# Patient Record
Sex: Male | Born: 1955 | Race: White | Hispanic: No | Marital: Single | Smoking: Former smoker
Health system: Southern US, Community
[De-identification: ages and names within clinical notes are randomized; demographics above are authoritative.]

## PROBLEM LIST (undated history)

## (undated) DIAGNOSIS — I272 Pulmonary hypertension, unspecified: Secondary | ICD-10-CM

## (undated) DIAGNOSIS — Z8659 Personal history of other mental and behavioral disorders: Secondary | ICD-10-CM

## (undated) DIAGNOSIS — C44599 Other specified malignant neoplasm of skin of other part of trunk: Secondary | ICD-10-CM

## (undated) DIAGNOSIS — I83899 Varicose veins of unspecified lower extremities with other complications: Secondary | ICD-10-CM

## (undated) DIAGNOSIS — F17200 Nicotine dependence, unspecified, uncomplicated: Secondary | ICD-10-CM

## (undated) DIAGNOSIS — K449 Diaphragmatic hernia without obstruction or gangrene: Secondary | ICD-10-CM

## (undated) HISTORY — DX: Varicose veins of unspecified lower extremity with other complications: I83.899

## (undated) HISTORY — DX: Other specified malignant neoplasm of skin of other part of trunk: C44.599

## (undated) HISTORY — DX: Pulmonary hypertension, unspecified: I27.20

## (undated) HISTORY — PX: OTHER SURGICAL HISTORY: SHX169

## (undated) HISTORY — DX: Nicotine dependence, unspecified, uncomplicated: F17.200

## (undated) HISTORY — DX: Diaphragmatic hernia without obstruction or gangrene: K44.9

## (undated) HISTORY — DX: Personal history of other mental and behavioral disorders: Z86.59

---

## 2008-01-10 ENCOUNTER — Emergency Department: Payer: Self-pay | Admitting: Emergency Medicine

## 2008-01-16 ENCOUNTER — Emergency Department (HOSPITAL_COMMUNITY): Admission: EM | Admit: 2008-01-16 | Discharge: 2008-01-16 | Payer: Self-pay | Admitting: Family Medicine

## 2008-02-22 ENCOUNTER — Emergency Department: Payer: Self-pay | Admitting: Emergency Medicine

## 2008-03-05 DIAGNOSIS — C44599 Other specified malignant neoplasm of skin of other part of trunk: Secondary | ICD-10-CM

## 2008-03-05 DIAGNOSIS — C44509 Unspecified malignant neoplasm of skin of other part of trunk: Secondary | ICD-10-CM

## 2008-03-05 HISTORY — DX: Unspecified malignant neoplasm of skin of other part of trunk: C44.509

## 2008-03-05 HISTORY — DX: Other specified malignant neoplasm of skin of other part of trunk: C44.599

## 2008-04-19 ENCOUNTER — Ambulatory Visit: Payer: Self-pay | Admitting: Nurse Practitioner

## 2008-04-19 DIAGNOSIS — F17210 Nicotine dependence, cigarettes, uncomplicated: Secondary | ICD-10-CM

## 2008-04-19 DIAGNOSIS — I83899 Varicose veins of unspecified lower extremities with other complications: Secondary | ICD-10-CM

## 2008-04-19 LAB — CONVERTED CEMR LAB
ALT: 18 units/L (ref 0–53)
AST: 17 units/L (ref 0–37)
Albumin: 4.6 g/dL (ref 3.5–5.2)
Alkaline Phosphatase: 75 units/L (ref 39–117)
BUN: 7 mg/dL (ref 6–23)
Basophils Absolute: 0.1 10*3/uL (ref 0.0–0.1)
Basophils Relative: 1 % (ref 0–1)
CO2: 23 meq/L (ref 19–32)
Calcium: 9.8 mg/dL (ref 8.4–10.5)
Chloride: 101 meq/L (ref 96–112)
Creatinine, Ser: 0.72 mg/dL (ref 0.40–1.50)
Eosinophils Absolute: 0.1 10*3/uL (ref 0.0–0.7)
Eosinophils Relative: 1 % (ref 0–5)
Glucose, Bld: 83 mg/dL (ref 70–99)
HCT: 50 % (ref 39.0–52.0)
Hemoglobin: 16.7 g/dL (ref 13.0–17.0)
Lymphocytes Relative: 20 % (ref 12–46)
Lymphs Abs: 2.1 10*3/uL (ref 0.7–4.0)
MCHC: 33.4 g/dL (ref 30.0–36.0)
MCV: 92.6 fL (ref 78.0–100.0)
Monocytes Absolute: 0.8 10*3/uL (ref 0.1–1.0)
Monocytes Relative: 8 % (ref 3–12)
Neutro Abs: 7.7 10*3/uL (ref 1.7–7.7)
Neutrophils Relative %: 71 % (ref 43–77)
Platelets: 261 10*3/uL (ref 150–400)
Potassium: 5.3 meq/L (ref 3.5–5.3)
RBC: 5.4 M/uL (ref 4.22–5.81)
RDW: 13.4 % (ref 11.5–15.5)
Sodium: 140 meq/L (ref 135–145)
TSH: 1.301 microintl units/mL (ref 0.350–4.50)
Total Bilirubin: 0.4 mg/dL (ref 0.3–1.2)
Total Protein: 7.8 g/dL (ref 6.0–8.3)
WBC: 10.9 10*3/uL — ABNORMAL HIGH (ref 4.0–10.5)

## 2008-04-20 ENCOUNTER — Ambulatory Visit: Payer: Self-pay | Admitting: *Deleted

## 2008-05-04 ENCOUNTER — Ambulatory Visit: Payer: Self-pay | Admitting: Nurse Practitioner

## 2008-05-06 DIAGNOSIS — C4490 Unspecified malignant neoplasm of skin, unspecified: Secondary | ICD-10-CM

## 2008-05-14 ENCOUNTER — Telehealth (INDEPENDENT_AMBULATORY_CARE_PROVIDER_SITE_OTHER): Payer: Self-pay | Admitting: Nurse Practitioner

## 2008-05-21 ENCOUNTER — Ambulatory Visit: Payer: Self-pay | Admitting: Nurse Practitioner

## 2008-06-01 ENCOUNTER — Telehealth (INDEPENDENT_AMBULATORY_CARE_PROVIDER_SITE_OTHER): Payer: Self-pay | Admitting: Nurse Practitioner

## 2008-06-17 ENCOUNTER — Encounter (INDEPENDENT_AMBULATORY_CARE_PROVIDER_SITE_OTHER): Payer: Self-pay | Admitting: Nurse Practitioner

## 2008-06-23 ENCOUNTER — Encounter (INDEPENDENT_AMBULATORY_CARE_PROVIDER_SITE_OTHER): Payer: Self-pay | Admitting: Nurse Practitioner

## 2008-07-01 ENCOUNTER — Ambulatory Visit: Payer: Self-pay | Admitting: Nurse Practitioner

## 2008-07-01 DIAGNOSIS — Z8659 Personal history of other mental and behavioral disorders: Secondary | ICD-10-CM | POA: Insufficient documentation

## 2008-07-19 ENCOUNTER — Encounter (INDEPENDENT_AMBULATORY_CARE_PROVIDER_SITE_OTHER): Payer: Self-pay | Admitting: Nurse Practitioner

## 2008-08-09 ENCOUNTER — Encounter (INDEPENDENT_AMBULATORY_CARE_PROVIDER_SITE_OTHER): Payer: Self-pay | Admitting: Nurse Practitioner

## 2009-03-05 HISTORY — PX: SKIN CANCER EXCISION: SHX779

## 2010-11-16 ENCOUNTER — Encounter: Payer: Self-pay | Admitting: Family Medicine

## 2010-11-16 ENCOUNTER — Ambulatory Visit (INDEPENDENT_AMBULATORY_CARE_PROVIDER_SITE_OTHER): Payer: Self-pay | Admitting: Family Medicine

## 2010-11-16 DIAGNOSIS — F17200 Nicotine dependence, unspecified, uncomplicated: Secondary | ICD-10-CM

## 2010-11-16 DIAGNOSIS — Z8659 Personal history of other mental and behavioral disorders: Secondary | ICD-10-CM

## 2010-11-16 DIAGNOSIS — Z0289 Encounter for other administrative examinations: Secondary | ICD-10-CM

## 2010-11-16 NOTE — Progress Notes (Addendum)
Subjective:    Patient ID: Robert Griffin, male    DOB: [birth year], 55 y.o.   MRN: [id number]  HPI CC: new pt, establish   Would like form filled out.  Previously saw doctor that moved out of state (was in [place]).  Looks like previously saw Geographical information systems officer. Has regular driver's license but received DOT physical form to be filled out for medical clearance.  Unsure why. Does not drive commercially. States no limitation to driving.   No recent tickets.   Was checked in to [facility] hospital by sister 10 yrs ago, stayed 2-3 wks.  Doesn't know why this was done. H/o DUI 1970s.  No problems with EtOH currently. Hx schizophrenia in chart.  dx in early 20's per last OV note.  He has has several admissions to [facility] in the past. The last admission over 10 years ago.  No current medications  No current mental health treatment, not followed by psychiatrist currently.  States stable. Pt lives alone. Family is close by for support.  3 brothers, 3 sisters. No reports of abnormal behavior ie, agression, hearing voices, talking to himself.  Preventative: CPE last 2-3 years ago Unsure tetanus shot, thinks >10 yrs ago. Never had colonoscopy - discussed screening methods as well as relative costs, pt prefers to hold off for now.  No insurance. Discussed prostate screening, given father with h/o prostate cancer.  Pt prefers to hold off for now as well.  Medications and allergies reviewed and updated in chart.  Past histories reviewed and updated if relevant as below. Patient Active Problem List  Diagnoses  . CARCINOMA, BASAL CELL  . TOBACCO ABUSE  . VARICOSE VEINS, LOWER EXTREMITIES  . SCHIZOPHRENIA, HX OF   Past Medical History  Diagnosis Date  . History of schizophrenia   . Skin cancer of trunk 2011    shoulder, BCC s/p derm removal   Past Surgical History  Procedure Date  . Skin cancer excision 2011    on back  . Left wrist sugery 1980s    hand through glass, tendon reconstruction    History  Substance Use Topics  . Smoking status: Current Everyday Smoker -- 1.0 packs/day    Types: Cigarettes  . Smokeless tobacco: Never Used  . Alcohol Use: Yes     Occasional (6-12 beers/month)   Family History  Problem Relation Age of Onset  . Ovarian cancer Mother   . Cancer Mother     ?ovarian  . Prostate cancer Father   . Cancer Father 33    prostate cancer  . Mental illness Paternal Grandfather   . Coronary artery disease Neg Hx   . Stroke Neg Hx   . Diabetes Neg Hx   . Hyperlipidemia Neg Hx   . Alcohol abuse Neg Hx   . Arthritis Neg Hx    No Known Allergies No current outpatient prescriptions on file prior to visit.   Review of Systems  Constitutional: Negative for fever, chills, activity change, appetite change, fatigue and unexpected weight change.  HENT: Negative for hearing loss and neck pain.   Eyes: Negative for visual disturbance.  Respiratory: Negative for cough, chest tightness, shortness of breath and wheezing.   Cardiovascular: Negative for chest pain, palpitations and leg swelling.  Gastrointestinal: Negative for nausea, vomiting, abdominal pain, diarrhea, constipation, blood in stool and abdominal distention.  Genitourinary: Negative for hematuria and difficulty urinating.  Musculoskeletal: Negative for myalgias and arthralgias.  Skin: Negative for rash.  Neurological: Negative for dizziness, seizures, syncope and headaches.  Hematological: Does not bruise/bleed easily.  Psychiatric/Behavioral: Negative for dysphoric mood. The patient is not nervous/anxious.        Objective:   Physical Exam  Nursing note and vitals reviewed. Constitutional: He is oriented to person, place, and time. He appears well-developed and well-nourished. No distress.  HENT:  Head: Normocephalic and atraumatic.  Right Ear: External ear normal.  Left Ear: External ear normal.  Nose: Nose normal.  Mouth/Throat: Oropharynx is clear and moist.  Eyes: Conjunctivae and  EOM are normal. Pupils are equal, round, and reactive to light.  Neck: Normal range of motion. Neck supple. No thyromegaly present.  Cardiovascular: Normal rate, regular rhythm, normal heart sounds and intact distal pulses.   No murmur heard. Pulses:      Radial pulses are 2+ on the right side, and 2+ on the left side.  Pulmonary/Chest: Effort normal and breath sounds normal. No respiratory distress. He has no wheezes. He has no rales.  Abdominal: Soft. Bowel sounds are normal. He exhibits no distension and no mass. There is no tenderness. There is no rebound and no guarding.  Musculoskeletal: Normal range of motion.  Lymphadenopathy:    He has no cervical adenopathy.  Neurological: He is alert and oriented to person, place, and time.       CN grossly intact, station and gait intact  Skin: Skin is warm and dry. No rash noted.       Clubbing, no cyanosis  Psychiatric: His speech is normal and behavior is normal. Judgment and thought content normal. His affect is blunt. He is not agitated, not aggressive and is not hyperactive. Thought content is not paranoid and not delusional.          Assessment & Plan:

## 2010-11-16 NOTE — Assessment & Plan Note (Signed)
No records available currently on mental health history. Discussed reason for filling out form is hx schizophrenia in chart. Filled out form as able, discussed with pt they may want previous doctor's recs as this is my first visit with pt. Seems like pt has been stable off meds for >10 yrs, I don't see any reason to currently limit or restrict driving.

## 2010-11-16 NOTE — Assessment & Plan Note (Signed)
Encouraged cessation, 1 ppd currently.  Clubbing on exam today but pulse ox 98%.

## 2010-11-16 NOTE — Patient Instructions (Addendum)
Think about colon and prostate screening. Keep working on cutting back on smoking. If you want help with this, return to see [place]. Form for DOT filled out today. You are due to have cholesterol checked.  We will likely do this next time - return fasting for next appointment. Good to meet you today, call us with questions.  Return at your convenience for physical or as needed.

## 2010-12-05 LAB — GLUCOSE, CAPILLARY: Glucose-Capillary: 106 mg/dL — ABNORMAL HIGH (ref 70–99)

## 2010-12-05 LAB — POCT I-STAT, CHEM 8
Creatinine, Ser: 0.8 mg/dL (ref 0.4–1.5)
Hemoglobin: 18.4 g/dL — ABNORMAL HIGH (ref 13.0–17.0)
Potassium: 4 mEq/L (ref 3.5–5.1)
Sodium: 138 mEq/L (ref 135–145)
TCO2: 28 mmol/L (ref 0–100)

## 2012-04-04 ENCOUNTER — Encounter: Payer: Self-pay | Admitting: Family Medicine

## 2012-04-04 ENCOUNTER — Ambulatory Visit (INDEPENDENT_AMBULATORY_CARE_PROVIDER_SITE_OTHER): Payer: Self-pay | Admitting: Family Medicine

## 2012-04-04 VITALS — BP 120/70 | HR 71 | Temp 98.0°F | Ht 69.0 in | Wt 213.8 lb

## 2012-04-04 DIAGNOSIS — I8390 Asymptomatic varicose veins of unspecified lower extremity: Secondary | ICD-10-CM

## 2012-04-04 DIAGNOSIS — Z0279 Encounter for issue of other medical certificate: Secondary | ICD-10-CM

## 2012-04-04 DIAGNOSIS — Z8659 Personal history of other mental and behavioral disorders: Secondary | ICD-10-CM

## 2012-04-04 DIAGNOSIS — F17200 Nicotine dependence, unspecified, uncomplicated: Secondary | ICD-10-CM

## 2012-04-04 NOTE — Assessment & Plan Note (Signed)
Encourage cessation. °

## 2012-04-04 NOTE — Assessment & Plan Note (Signed)
Continue compression stockings

## 2012-04-04 NOTE — Assessment & Plan Note (Signed)
Compliant with [facility] treatment plan.

## 2012-04-04 NOTE — Patient Instructions (Addendum)
Return in 1 year or as needed. I suggest considering flu shot, tetanus shot, colon cancer screening ,prostate cancer screening (especially given family history), and blood work to check cholesterol and sugar. Cut down on alcohol, cut back on smoking.   best thing for your health is quitting smoking. Sign up for insurance for above to be covered.
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
## 2015-05-30 ENCOUNTER — Other Ambulatory Visit: Payer: Self-pay | Admitting: Family Medicine

## 2015-05-30 ENCOUNTER — Ambulatory Visit
Admission: RE | Admit: 2015-05-30 | Discharge: 2015-05-30 | Disposition: A | Discharge status: Home / Self Care | Payer: Medicaid Other | Source: Ambulatory Visit | Attending: Family Medicine | Admitting: Family Medicine

## 2015-05-30 DIAGNOSIS — I509 Heart failure, unspecified: Secondary | ICD-10-CM

## 2015-05-30 DIAGNOSIS — R0902 Hypoxemia: Secondary | ICD-10-CM

## 2015-06-04 DIAGNOSIS — I272 Pulmonary hypertension, unspecified: Secondary | ICD-10-CM

## 2015-06-04 HISTORY — DX: Pulmonary hypertension, unspecified: I27.20

## 2015-06-08 ENCOUNTER — Encounter: Payer: Self-pay | Admitting: Cardiology

## 2015-06-08 ENCOUNTER — Ambulatory Visit (INDEPENDENT_AMBULATORY_CARE_PROVIDER_SITE_OTHER): Payer: Medicaid Other | Admitting: Cardiology

## 2015-06-08 VITALS — BP 110/80 | HR 76 | Ht 70.5 in | Wt 248.8 lb

## 2015-06-08 DIAGNOSIS — I83893 Varicose veins of bilateral lower extremities with other complications: Secondary | ICD-10-CM

## 2015-06-08 DIAGNOSIS — R9431 Abnormal electrocardiogram [ECG] [EKG]: Secondary | ICD-10-CM | POA: Diagnosis not present

## 2015-06-08 DIAGNOSIS — F17200 Nicotine dependence, unspecified, uncomplicated: Secondary | ICD-10-CM | POA: Diagnosis not present

## 2015-06-08 DIAGNOSIS — R079 Chest pain, unspecified: Secondary | ICD-10-CM | POA: Diagnosis not present

## 2015-06-08 NOTE — Patient Instructions (Addendum)
Medication Instructions:  Your physician recommends that you continue on your current medications as directed. Please refer to the Current Medication list given to you today.   Labwork: none  Testing/Procedures: Your physician has requested that you have an echocardiogram. Echocardiography is a painless test that uses sound waves to create images of your heart. It provides your doctor with information about the size and shape of your heart and how well your heart's chambers and valves are working. This procedure takes approximately one hour. There are no restrictions for this procedure.    Follow-Up: Your physician recommends that you schedule a follow-up appointment in: two months with Dr. Ellyn Hack.    Any Other Special Instructions Will Be Listed Below (If Applicable).     If you need a refill on your cardiac medications before your next appointment, please call your pharmacy.  Echocardiogram An echocardiogram, or echocardiography, uses sound waves (ultrasound) to produce an image of your heart. The echocardiogram is simple, painless, obtained within a short period of time, and offers valuable information to your health care provider. The images from an echocardiogram can provide information such as:  Evidence of coronary artery disease (CAD).  Heart size.  Heart muscle function.  Heart valve function.  Aneurysm detection.  Evidence of a past heart attack.  Fluid buildup around the heart.  Heart muscle thickening.  Assess heart valve function. LET [facility] CARE PROVIDER KNOW ABOUT:  Any allergies you have.  All medicines you are taking, including vitamins, herbs, eye drops, creams, and over-the-counter medicines.  Previous problems you or members of your family have had with the use of anesthetics.  Any blood disorders you have.  Previous surgeries you have had.  Medical conditions you have.  Possibility of pregnancy, if this applies. BEFORE THE PROCEDURE   No special preparation is needed. Eat and drink normally.  PROCEDURE   In order to produce an image of your heart, gel will be applied to your chest and a wand-like tool (transducer) will be moved over your chest. The gel will help transmit the sound waves from the transducer. The sound waves will harmlessly bounce off your heart to allow the heart images to be captured in real-time motion. These images will then be recorded.  You may need an IV to receive a medicine that improves the quality of the pictures. AFTER THE PROCEDURE You may return to your normal schedule including diet, activities, and medicines, unless your health care provider tells you otherwise.   This information is not intended to replace advice given to you by your health care provider. Make sure you discuss any questions you have with your health care provider.   Document Released: 02/17/2000 Document Revised: 03/12/2014 Document Reviewed: 10/27/2012 Elsevier Interactive Patient Education [employer].

## 2015-06-08 NOTE — Progress Notes (Signed)
PATIENT: Robert Griffin MRN: [id number] DOB: 1955-09-16 PCP: Ria Bush, MD  Clinic Note: Chief Complaint  Patient presents with  . Advice Only    C/o sob. Meds reviewed verbally with pt.    HPI: Robert Griffin is a 60 y.o. male with a PMH below who presents today for Cardiology evaluation.He actually doesn't know why he is here. He cannot tell me anything more than his chronic edema and some exertional dyspnea.  Interval History: Robert Griffin was seen by his primary physician and referred for cardiology consultation with minimal inflammation indicating bilateral leg edema, basal rales with possible ascites and hypoxia (somehow his room air sat was 68%. He denied any significant resting dyspnea to me.  He was given a diagnosis of possible CHF and started on Lasix and lisinopril. No comment upon EKG or other findings.  [name] indicates to me that he had a significant amount of edema for a long time. Robert Griffin has gotten little bit worse and his legs are getting little bit uncomfortable with walking. He is a very difficult historian and is not very forthcoming with information. As far as I can tell he will get short of breath a little bit with walking maybe 200 yards, but really doesn't do much walking. If he does stairs or walks up an incline he will get a little bit short of breath as well. He denies any chest tightness or pressure associated with it. He denies any PND, orthopnea or edema but he does wake up sometimes short of breath, just not consistent with PND.  He has had some unusual twinging-like sensations in his chest off and on that is not associated with any particular activity. Not exacerbated by exertion.  The remainder of cardiac review of systems is as follows: Cardiovascular ROS:He denies any rapid irregular heartbeats palpitations. No sig. Near-syncope, TIA or amaurosis fugax symptoms.  Past Medical History  Diagnosis Date  . History of schizophrenia     [facility]  . Skin cancer of  trunk 2010    shoulder, BCC s/p derm removal  . Smoker     Chronic long-term smoker  . Hiatal hernia   . 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 Recently started on Lasix.    Prior Cardiac Evaluation and Past Surgical History: Past Surgical History  Procedure Laterality Date  . Skin cancer excision  2011    on back  . Left wrist sugery  1980s    hand through glass, tendon reconstruction    Allergies  Allergen Reactions  . Asa [Aspirin] Other (See Comments)    As a child unsure of reaction  . Penicillins Other (See Comments)    Pt cannot remember type of reaction    Current Outpatient Prescriptions  Medication Sig Dispense Refill  . furosemide (LASIX) 40 MG tablet Take 40 mg by mouth 2 (two) times daily.  1  . lisinopril (PRINIVIL,ZESTRIL) 30 MG tablet Take 30 mg by mouth daily.  0  . Paliperidone Palmitate (INVEGA SUSTENNA IM) Inject into the muscle. One injection once a month    . VITAMIN D, CHOLECALCIFEROL, PO Take by mouth once a week.     No current facility-administered medications for this visit.   Social History   Social History  . Marital Status: Single    Spouse Name: N/A  . Number of Children: N/A  . Years of Education: N/A   Occupational History  . unemployed    Social History  Main Topics  . Smoking status: Current Every Day Smoker -- 1.00 packs/day for 40 years    Types: Cigarettes  . Smokeless tobacco: Never Used     Comment: He rolls his own cigarettes - both tobacco and marijuana. No interest in stopping  . Alcohol Use: 0.0 oz/week    0 Standard drinks or equivalent per week     Comment: (6-12 beers/week)  . Drug Use: Yes    Special: Marijuana     Comment: MJ  . Sexual Activity: Not Asked   Other Topics Concern  . None   Social History Narrative   Caffeine: 2 cups coffee, 3 cups tea daily   Lives alone, 1 dog   Family in area - sister brought him today. Has 3 brothers and 3 sisters    Occupation: unemployed, was painting   9th grade education   Activity: no regular exercise   Diet: likes vegetables, good amt water         Family History family history includes Cancer in his mother; Cancer (age of onset: 29) in his father; Mental illness in his paternal grandfather. There is no history of Coronary artery disease, Stroke, Diabetes, Hyperlipidemia, Alcohol abuse, or Arthritis.  ROS: A comprehensive Review of Systems - Was performed. However he is a very difficult historian Review of Systems  Constitutional: Positive for weight loss (His weight is going down from about 260-248 pounds since starting Lasix.). Negative for malaise/fatigue.  HENT: Positive for congestion. Negative for nosebleeds.   Respiratory: Positive for cough (Chronic), shortness of breath (With walking maybe more than 200 feet or upstairs) and wheezing.   Cardiovascular: Positive for leg swelling (Chronic lower extremity swelling from venous stasis. This is been since 32. Finally now on Lasix. He has chronically used compression stockings.).  Gastrointestinal: Positive for heartburn. Negative for diarrhea, constipation and blood in stool.  Genitourinary: Negative for hematuria.  Musculoskeletal: Positive for back pain. Negative for joint pain and falls.  Neurological: Positive for headaches (ntermittent).       Neuropathic type pains in his feet  Psychiatric/Behavioral:       He has a diagnosis of schizophrenia    Weight from PCPs office was 261 pounds PHYSICAL EXAM BP 110/80 mmHg  Pulse 76  Ht 5' 10.5" (1.791 m)  Wt 248 lb 12 oz (112.832 kg)  BMI 35.18 kg/m2 General appearance: alert, cooperative, appears stated age, no distress, moderately obese and Somewhat unkempt appearing. Smells like cigarettes, and likely not bathed Neck: no adenopathy, no carotid bruit and Mild JVD with mild HJR. Difficult to tell due to body habitus. No LAN or thyromegaly Lungs: Diffuse infiltrate in a story  wheezes/rhonchi. I don't hear rales, or rhonchi. No dullness to percussion. There is accessory muscle use, but no increased worker breathing. Heart: normal apical impulse, regular rate and rhythm, S1, S2 normal, no S3 or S4 and No murmurs or rubs Abdomen: soft, non-tender; bowel sounds normal; no masses,  no organomegaly and Obese with potentially mild truncal edema. Does not seem to be any presacral edema however. Extremities: edema At least 2+ from mid thigh to knee. Tense 3+ bilateral edema from the knees to the ankles., no ulcers, gangrene or trophic changes, varicose veins noted and venous stasis dermatitis noted Pulses: 2+ and symmetric However pedal pulses are difficult to palpate due to edema. Skin: Brawny skin changes and bilateral lower extremity is consistent with venous stasis. Otherwise no rashes or lesions. Neurologic: Mental status: alertness: alert, orientation: time, date, person,  place, city, president, affect: blunted and flat, thought content exhibits logical connections, But is a poor historian Cranial nerves: normal Motor: grossly normal Gait: Normal   Adult ECG Report  Rate: 76 ;  Rhythm: normal sinus rhythm and 1 A-V block (208), rightward axis (102), inferior and anterior T-wave inversions. Cannot exclude ischemia.  Narrative Interpretation: Abnormal EKG with asymmetrical T-wave inversions in the absence of any anginal symptoms.  Recent Labs: None available  ASSESSMENT / PLAN: Very difficult situation, he clearly has chronic lower TIMI edema which is probably venous stasis in nature. He doesn't have a lot of heart failure symptoms. Although he didn't notice initially having any dyspnea or difficulties, he says that once he started his heart medicines felt that his heart was beating better, but he can't explain it any further. He has an abnormal EKG that would otherwise be concerning for ischemic changes, however he is not describing any anginal symptoms. I would've a low  threshold to consider stress test, for now I need to get a little better sense of what his symptoms are.   For now, I'll continue his ACE inhibitor and Lasix and check a 2-D echocardiogram to get a better assessment was EF is as well as diastolic function.  I will see him back soon in order to adjust any therapy is necessary.  Problem List Items Addressed This Visit    Varicose veins of lower extremity with edema (Chronic)    Checking 2-D echocardiogram to assess if there is any cardiac etiology involved. Continue compression stockings and Lasix.      Relevant Medications   furosemide (LASIX) 40 MG tablet   lisinopril (PRINIVIL,ZESTRIL) 30 MG tablet   TOBACCO ABUSE    Although I talked about smoking cessation, he did not seem to acknowledge my recommendation.      Pain in the chest - Primary    This discomfort does not sound at all cardiac in nature. We'll continue to monitor for symptoms based on the fact is that abnormal EKG. I don't think the EKG is ischemic in nature.      Relevant Medications   furosemide (LASIX) 40 MG tablet   lisinopril (PRINIVIL,ZESTRIL) 30 MG tablet   Other Relevant Orders   EKG 12-Lead (Completed)   Abnormal EKG    EKG is deathly abnormal with "cannot exclude ischemia "component. However he is not really describing anginal symptoms. I will have a low threshold to check ischemia with a stress test, however for now we'll simply start with an echocardiogram and reassess in 2 months.      Relevant Orders   Echocardiogram (Completed)      Meds ordered this encounter  Medications  . furosemide (LASIX) 40 MG tablet    Sig: Take 40 mg by mouth 2 (two) times daily.    Refill:  1  . lisinopril (PRINIVIL,ZESTRIL) 30 MG tablet    Sig: Take 30 mg by mouth daily.    Refill:  0  . VITAMIN D, CHOLECALCIFEROL, PO    Sig: Take by mouth once a week.    Followup: 2 months  Alexsus Papadopoulos W. Ellyn Hack, M.D., M.S. Interventional Cardiol[facility]

## 2015-06-10 ENCOUNTER — Other Ambulatory Visit (INDEPENDENT_AMBULATORY_CARE_PROVIDER_SITE_OTHER): Payer: Medicaid Other

## 2015-06-10 ENCOUNTER — Other Ambulatory Visit: Payer: Self-pay

## 2015-06-10 ENCOUNTER — Encounter: Payer: Self-pay | Admitting: Cardiology

## 2015-06-10 DIAGNOSIS — R079 Chest pain, unspecified: Secondary | ICD-10-CM | POA: Insufficient documentation

## 2015-06-10 DIAGNOSIS — R9431 Abnormal electrocardiogram [ECG] [EKG]: Secondary | ICD-10-CM | POA: Diagnosis not present

## 2015-06-10 NOTE — Assessment & Plan Note (Signed)
This discomfort does not sound at all cardiac in nature. We'll continue to monitor for symptoms based on the fact is that abnormal EKG. I don't think the EKG is ischemic in nature.

## 2015-06-10 NOTE — Assessment & Plan Note (Signed)
EKG is deathly abnormal with "cannot exclude ischemia "component. However he is not really describing anginal symptoms. I will have a low threshold to check ischemia with a stress test, however for now we'll simply start with an echocardiogram and reassess in 2 months.

## 2015-06-10 NOTE — Assessment & Plan Note (Signed)
Checking 2-D echocardiogram to assess if there is any cardiac etiology involved. Continue compression stockings and Lasix.

## 2015-06-10 NOTE — Assessment & Plan Note (Signed)
Although I talked about smoking cessation, he did not seem to acknowledge my recommendation.

## 2015-06-14 ENCOUNTER — Encounter: Payer: Self-pay | Admitting: Family Medicine

## 2015-08-08 ENCOUNTER — Telehealth: Payer: Self-pay | Admitting: Cardiology

## 2015-08-08 NOTE — Telephone Encounter (Signed)
Have him take 80 BID Lasix x 3 days, then 80 am & 40 pm x 3 days.  If no better after 80 mg BID (i.e no brisk urine output) - would recommend going in to ER -- may need IV Lasix.   Glenetta Hew, MD

## 2015-08-08 NOTE — Telephone Encounter (Signed)
Pt c/o swelling: STAT is pt has developed SOB within 24 hours  1. How long have you been experiencing swelling? Couple months, but has gotten worse  2. Where is the swelling located? Both feet, legs, and stomach  3.  Are you currently taking a "fluid pill"? no  4.  Are you currently SOB? Has been SOB when he walks  5.  Have you traveled recently? no

## 2015-08-08 NOTE — Telephone Encounter (Signed)
Called pt at home number. No answer, no VM. S/w pt sister, Gwinda Passe, (on [place]) who reports pt has had increased fluid retention. She is unsure when this actually started but states "he looks like he is going to have triplets" He has been experiencing abdominal and LE swelling. States pt does not have scales therefore, is unsure of how much weight pt has gained.  Sister is unsure of what medications he currently takes however, she states he takes two pills and his vitamin every day. Reports SOB "if he walks a little bit. It doesn't take much to get him winded". Pt lives alone, approximately 8 miles from his sister and does not drive. Family provides transportation. Sister contacted Dr. Rex Kras, PCP in [place] regarding sx and states she was told to call cardiology. Dr. Rex Kras provides refills on lasix and lisinopril and sister thinks he is still taking these. He has f/u appt June 14 w/Dr. Ellyn Hack.  Per echo results notes:   "Cardiac shows normal left ventricle function. Normal regional wall motion and normal relaxation. It also shows significantly elevated pressures in the right ventricle suggesting pulmonary hypertension. Unfortunately were not able to calculate the level of pulmonary hypertension.  When I see him back in follow-up, we can discuss potentially doing a right heart catheterization to accurately measure pressures.  Most likely this is related to OSA - this could be contributing to his edema."  Suggested sister have pt purchase or borrow scales for monitoring daily weights, continue current medications, follow low sodium diet. Advised her to have pt seen in ED setting if continued SOB and swelling.  Will forward to MD to make aware. She verbalized understanding and is agreeable w/plan. She had no further questions.

## 2015-08-09 NOTE — Telephone Encounter (Signed)
Spoke w/ Gwinda Passe.  Advised her of Dr. Allison Quarry recommendation.   She verbalizes understanding and will call back if sx do not improve.

## 2015-08-17 ENCOUNTER — Ambulatory Visit: Payer: Medicaid Other | Admitting: Cardiology

## 2015-08-17 ENCOUNTER — Encounter: Payer: Self-pay | Admitting: *Deleted

## 2015-08-27 ENCOUNTER — Encounter (HOSPITAL_COMMUNITY): Payer: Self-pay | Admitting: *Deleted

## 2015-08-27 ENCOUNTER — Inpatient Hospital Stay (HOSPITAL_COMMUNITY)
Admission: EM | Admit: 2015-08-27 | Discharge: 2015-09-01 | DRG: 291 | Disposition: A | Discharge status: Home / Self Care | Payer: Medicaid Other | Attending: Internal Medicine | Admitting: Internal Medicine

## 2015-08-27 ENCOUNTER — Emergency Department (HOSPITAL_COMMUNITY): Payer: Medicaid Other

## 2015-08-27 DIAGNOSIS — E876 Hypokalemia: Secondary | ICD-10-CM | POA: Diagnosis present

## 2015-08-27 DIAGNOSIS — L97519 Non-pressure chronic ulcer of other part of right foot with unspecified severity: Secondary | ICD-10-CM | POA: Diagnosis present

## 2015-08-27 DIAGNOSIS — I871 Compression of vein: Secondary | ICD-10-CM

## 2015-08-27 DIAGNOSIS — J8410 Pulmonary fibrosis, unspecified: Secondary | ICD-10-CM | POA: Diagnosis present

## 2015-08-27 DIAGNOSIS — F1010 Alcohol abuse, uncomplicated: Secondary | ICD-10-CM

## 2015-08-27 DIAGNOSIS — J9601 Acute respiratory failure with hypoxia: Secondary | ICD-10-CM | POA: Diagnosis present

## 2015-08-27 DIAGNOSIS — I110 Hypertensive heart disease with heart failure: Principal | ICD-10-CM | POA: Diagnosis present

## 2015-08-27 DIAGNOSIS — J441 Chronic obstructive pulmonary disease with (acute) exacerbation: Secondary | ICD-10-CM | POA: Diagnosis present

## 2015-08-27 DIAGNOSIS — I872 Venous insufficiency (chronic) (peripheral): Secondary | ICD-10-CM | POA: Diagnosis present

## 2015-08-27 DIAGNOSIS — Z85828 Personal history of other malignant neoplasm of skin: Secondary | ICD-10-CM

## 2015-08-27 DIAGNOSIS — Z9114 Patient's other noncompliance with medication regimen: Secondary | ICD-10-CM

## 2015-08-27 DIAGNOSIS — Z818 Family history of other mental and behavioral disorders: Secondary | ICD-10-CM

## 2015-08-27 DIAGNOSIS — I5033 Acute on chronic diastolic (congestive) heart failure: Secondary | ICD-10-CM | POA: Diagnosis present

## 2015-08-27 DIAGNOSIS — Z046 Encounter for general psychiatric examination, requested by authority: Secondary | ICD-10-CM

## 2015-08-27 DIAGNOSIS — Z886 Allergy status to analgesic agent status: Secondary | ICD-10-CM

## 2015-08-27 DIAGNOSIS — R0902 Hypoxemia: Secondary | ICD-10-CM

## 2015-08-27 DIAGNOSIS — R140 Abdominal distension (gaseous): Secondary | ICD-10-CM

## 2015-08-27 DIAGNOSIS — Z8042 Family history of malignant neoplasm of prostate: Secondary | ICD-10-CM

## 2015-08-27 DIAGNOSIS — E872 Acidosis: Secondary | ICD-10-CM | POA: Diagnosis present

## 2015-08-27 DIAGNOSIS — R188 Other ascites: Secondary | ICD-10-CM | POA: Diagnosis present

## 2015-08-27 DIAGNOSIS — J9691 Respiratory failure, unspecified with hypoxia: Secondary | ICD-10-CM

## 2015-08-27 DIAGNOSIS — J9692 Respiratory failure, unspecified with hypercapnia: Secondary | ICD-10-CM

## 2015-08-27 DIAGNOSIS — D696 Thrombocytopenia, unspecified: Secondary | ICD-10-CM | POA: Diagnosis present

## 2015-08-27 DIAGNOSIS — F209 Schizophrenia, unspecified: Secondary | ICD-10-CM

## 2015-08-27 DIAGNOSIS — I272 Other secondary pulmonary hypertension: Secondary | ICD-10-CM | POA: Diagnosis present

## 2015-08-27 DIAGNOSIS — Z79899 Other long term (current) drug therapy: Secondary | ICD-10-CM

## 2015-08-27 DIAGNOSIS — F17210 Nicotine dependence, cigarettes, uncomplicated: Secondary | ICD-10-CM | POA: Diagnosis present

## 2015-08-27 DIAGNOSIS — D751 Secondary polycythemia: Secondary | ICD-10-CM | POA: Diagnosis present

## 2015-08-27 DIAGNOSIS — I2781 Cor pulmonale (chronic): Secondary | ICD-10-CM | POA: Diagnosis present

## 2015-08-27 DIAGNOSIS — Z8659 Personal history of other mental and behavioral disorders: Secondary | ICD-10-CM

## 2015-08-27 DIAGNOSIS — I2609 Other pulmonary embolism with acute cor pulmonale: Secondary | ICD-10-CM

## 2015-08-27 DIAGNOSIS — R748 Abnormal levels of other serum enzymes: Secondary | ICD-10-CM | POA: Diagnosis present

## 2015-08-27 DIAGNOSIS — I5031 Acute diastolic (congestive) heart failure: Secondary | ICD-10-CM | POA: Diagnosis present

## 2015-08-27 DIAGNOSIS — I509 Heart failure, unspecified: Secondary | ICD-10-CM

## 2015-08-27 DIAGNOSIS — Z880 Allergy status to penicillin: Secondary | ICD-10-CM

## 2015-08-27 LAB — CBC WITH DIFFERENTIAL/PLATELET
BASOS ABS: 0 10*3/uL (ref 0.0–0.1)
Basophils Relative: 1 %
EOS ABS: 0.3 10*3/uL (ref 0.0–0.7)
EOS PCT: 3 %
HEMATOCRIT: 52.3 % — AB (ref 39.0–52.0)
HEMOGLOBIN: 17.2 g/dL — AB (ref 13.0–17.0)
Lymphocytes Relative: 16 %
Lymphs Abs: 1.4 10*3/uL (ref 0.7–4.0)
MCH: 30.4 pg (ref 26.0–34.0)
MCHC: 32.9 g/dL (ref 30.0–36.0)
MCV: 92.6 fL (ref 78.0–100.0)
MONO ABS: 1.1 10*3/uL — AB (ref 0.1–1.0)
Monocytes Relative: 14 %
Neutro Abs: 5.6 10*3/uL (ref 1.7–7.7)
Neutrophils Relative %: 66 %
Platelets: 160 10*3/uL (ref 150–400)
RBC: 5.65 MIL/uL (ref 4.22–5.81)
RDW: 15.6 % — ABNORMAL HIGH (ref 11.5–15.5)
WBC: 8.4 10*3/uL (ref 4.0–10.5)

## 2015-08-27 LAB — COMPREHENSIVE METABOLIC PANEL
ALBUMIN: 3.1 g/dL — AB (ref 3.5–5.0)
ALK PHOS: 55 U/L (ref 38–126)
ALT: 18 U/L (ref 17–63)
AST: 28 U/L (ref 15–41)
Anion gap: 7 (ref 5–15)
BILIRUBIN TOTAL: 0.9 mg/dL (ref 0.3–1.2)
BUN: 7 mg/dL (ref 6–20)
CALCIUM: 8.6 mg/dL — AB (ref 8.9–10.3)
CO2: 27 mmol/L (ref 22–32)
Chloride: 101 mmol/L (ref 101–111)
Creatinine, Ser: 0.72 mg/dL (ref 0.61–1.24)
GFR calc Af Amer: >60 mL/min (ref >60)
GFR calc non Af Amer: >60 mL/min (ref >60)
GLUCOSE: 89 mg/dL (ref 65–99)
Potassium: 4.2 mmol/L (ref 3.5–5.1)
Sodium: 135 mmol/L (ref 135–145)
TOTAL PROTEIN: 6.7 g/dL (ref 6.5–8.1)

## 2015-08-27 LAB — PROTIME-INR
INR: 1.26 (ref 0.00–1.49)
Prothrombin Time: 15.9 seconds — ABNORMAL HIGH (ref 11.6–15.2)

## 2015-08-27 LAB — BRAIN NATRIURETIC PEPTIDE: B NATRIURETIC PEPTIDE 5: 432.9 pg/mL — AB (ref 0.0–100.0)

## 2015-08-27 LAB — ETHANOL: Alcohol, Ethyl (B): <5 mg/dL (ref <5)

## 2015-08-27 LAB — I-STAT TROPONIN, ED: TROPONIN I, POC: 0.06 ng/mL (ref 0.00–0.08)

## 2015-08-27 NOTE — ED Notes (Signed)
Pt has in belonging bag:  White t-shirt, cameo cargo shorts, cameo crocs.

## 2015-08-27 NOTE — ED Provider Notes (Signed)
CSN: [id number]     Arrival date & time 08/27/15  2202 History  By signing my name below, I, Meriel Flavors, attest that this documentation has been prepared under the direction and in the presence of Iola Turri PA-C.  Electronically Signed: Meriel Flavors, ED Scribe. 07/31/2015. 4:01 PM.   No chief complaint on file.  The history is provided by the patient and the police. No language interpreter was used.   HPI Comments: Robert Griffin is a 60 y.o. male with a PMHx of schizophrenia, skin cancer, HTN, EtOH abuse who presents to the Emergency Department after being IVC's by his sister due to pt stopping medications and reports that he has been barricading himself in his home. Pt was brought in by [facility] and states that pt admitted to stopping his medication and has been cooperative since he arrived at the pt's house. Pt denies any difficulty breathing recently. Pt also reports regular erythema and edema to his bilateral lower extremities. Pt states it has been several months since he has injected his [place] for schizophrenia. Pt states he has had surgery to his wrist and a skin cancer excision on his back. Pt denies any abnormal bowel or bladder habits.  Past Medical History  Diagnosis Date  . History of schizophrenia     [facility]  . Skin cancer of trunk 2010    shoulder, BCC s/p derm removal  . Smoker     Chronic long-term smoker  . Hiatal hernia   . 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 Recently started on Lasix.  . Pulmonary hypertension ([place]) 06/2015    by echo   Past Surgical History  Procedure Laterality Date  . Skin cancer excision  2011    on back  . Left wrist sugery  1980s    hand through glass, tendon reconstruction   Family History  Problem Relation Age of Onset  . Cancer Mother     ?ovarian  . Cancer Father 17    prostate cancer  . Mental illness Paternal Grandfather   . Coronary artery disease Neg Hx   .  Stroke Neg Hx   . Diabetes Neg Hx   . Hyperlipidemia Neg Hx   . Alcohol abuse Neg Hx   . Arthritis Neg Hx    Social History  Substance Use Topics  . Smoking status: Current Every Day Smoker -- 1.00 packs/day for 40 years    Types: Cigarettes  . Smokeless tobacco: Never Used     Comment: He rolls his own cigarettes - both tobacco and marijuana. No interest in stopping  . Alcohol Use: 0.0 oz/week    0 Standard drinks or equivalent per week     Comment: (6-12 beers/week)    Review of Systems  Constitutional: Negative for fever.  Respiratory: Negative for shortness of breath.       Allergies  Asa and Penicillins  Home Medications   Prior to Admission medications   Medication Sig Start Date End Date Taking? Authorizing Provider  acetaminophen (TYLENOL) 500 MG tablet Take 500 mg by mouth every 6 (six) hours as needed (for pain.).   Yes Historical Provider, MD  furosemide (LASIX) 40 MG tablet Take 40 mg by mouth 2 (two) times daily. 05/25/15  Yes Historical Provider, MD  ibuprofen (ADVIL,MOTRIN) 200 MG tablet Take 400 mg by mouth every 6 (six) hours as needed (for pain.).   Yes Historical Provider, MD  lisinopril (PRINIVIL,ZESTRIL) 30 MG  tablet Take 30 mg by mouth daily. 05/25/15  Yes Historical Provider, MD  Paliperidone Palmitate (INVEGA SUSTENNA IM) Inject 1 Syringe into the muscle every 30 (thirty) days. One injection once a month   Yes Historical Provider, MD  Vitamin D, Ergocalciferol, (DRISDOL) 50000 units CAPS capsule Take 50,000 Units by mouth once a week. 07/26/15   Historical Provider, MD   BP 126/85 mmHg  Pulse 83  Temp(Src) 97.6 F (36.4 C) (Oral)  Resp 22  SpO2 89%   Physical Exam  Constitutional: He is oriented to person, place, and time. He appears well-developed and well-nourished. No distress. He is not intubated.  HENT:  Head: Normocephalic and atraumatic.  Neck: Normal range of motion.  Pulmonary/Chest: Effort normal. No accessory muscle usage. No  tachypnea. He is not intubated. No respiratory distress. He has no decreased breath sounds. He has no wheezes.  Abdominal:  Large omentum  Musculoskeletal:  Swollen lower extremities  Neurological: He is alert and oriented to person, place, and time.  Skin: Skin is warm and dry. He is not diaphoretic.  Psychiatric: He has a normal mood and affect. Judgment normal. His mood appears not anxious. He is not actively hallucinating. He does not exhibit a depressed mood. He expresses no homicidal and no suicidal ideation. He expresses no suicidal plans and no homicidal plans.  Cooperative but does not want to be here.  Nursing note and vitals reviewed.   ED Course  Procedures  DIAGNOSTIC STUDIES: Oxygen Saturation is 70% on RA, low by my interpretation.  COORDINATION OF CARE: 10:45 PM-Will order blood work and imaging. Discussed treatment plan with pt at bedside and pt agreed to plan.   Labs Review Labs Reviewed  BRAIN NATRIURETIC PEPTIDE - Abnormal; Notable for the following:    B Natriuretic Peptide 432.9 (*)    All other components within normal limits  CBC WITH DIFFERENTIAL/PLATELET - Abnormal; Notable for the following:    Hemoglobin 17.2 (*)    HCT 52.3 (*)    RDW 15.6 (*)    Monocytes Absolute 1.1 (*)    All other components within normal limits  COMPREHENSIVE METABOLIC PANEL - Abnormal; Notable for the following:    Calcium 8.6 (*)    Albumin 3.1 (*)    All other components within normal limits  PROTIME-INR - Abnormal; Notable for the following:    Prothrombin Time 15.9 (*)    All other components within normal limits  BLOOD GAS, ARTERIAL - Abnormal; Notable for the following:    pCO2 arterial 53.4 (*)    pO2, Arterial 61.1 (*)    Bicarbonate 29.8 (*)    Acid-Base Excess 3.2 (*)    All other components within normal limits  ETHANOL  URINE RAPID DRUG SCREEN, HOSP PERFORMED  URINALYSIS, ROUTINE W REFLEX MICROSCOPIC (NOT AT [facility])  Randolm Idol, ED    Imaging  Review Dg Chest 2 View  08/27/2015  CLINICAL DATA:  Acute onset of hypoxia and cough. Initial encounter. EXAM: CHEST  2 VIEW COMPARISON:  Chest radiograph from 05/30/2015 FINDINGS: The lungs are well-aerated. Vascular congestion is noted. Increased interstitial markings raise concern for mild pulmonary edema. Small bilateral pleural effusions are seen. No pneumothorax is seen. The cardiomediastinal silhouette is mildly enlarged. No acute osseous abnormalities are identified. IMPRESSION: Vascular congestion and mild cardiomegaly. Increased interstitial markings raise concern for mild pulmonary edema. Small bilateral pleural effusions seen. Electronically Signed   By: Garald Balding M.D.   On: 08/27/2015 23:39   Ct Angio Chest  Pe W/cm &/or Wo Cm  08/28/2015  CLINICAL DATA:  60 year old male with hypoxia and shortness of breath EXAM: CT ANGIOGRAPHY CHEST WITH CONTRAST TECHNIQUE: Multidetector CT imaging of the chest was performed using the standard protocol during bolus administration of intravenous contrast. Multiplanar CT image reconstructions and MIPs were obtained to evaluate the vascular anatomy. CONTRAST:  100 cc Isovue 370 COMPARISON:  Chest radiograph dated 08/27/2015 FINDINGS: Evaluation of this exam is limited due to respiratory motion artifact. There is emphysematous changes of the lungs with chronic interstitial coarsening. There is no focal consolidation, pleural effusion, or pneumothorax. The central airways are patent. The thoracic aorta appears unremarkable. The origins of the great vessels of the aortic arch appear patent. No CT evidence of pulmonary embolism. Top-normal cardiac size. No pericardial effusion. There is coronary vascular calcification. Bilateral hilar adenopathy of indeterminate clinical significance. Although these may be reactive or related to underlying granulomatous disease, the possibility of metastatic disease is not excluded. Clinical correlation is recommended. The esophagus  is grossly unremarkable. No thyroid nodules identified. There is no axillary adenopathy. There is diffuse subcutaneous stranding and edema of the lower chest wall. There is mild degenerative changes of the spine. No acute fracture. Partially visualized small amount of ascites in the upper abdomen. An ill-defined linear 5.3 cm hypodensity in the right lobe of the liver is not well characterized but may represent vascular structure. Review of the MIP images confirms the above findings. IMPRESSION: No CT evidence of pulmonary embolism. Emphysema with chronic interstitial scarring. Bilateral hilar adenopathy of indeterminate clinical significance. Clinical correlation is recommended new Partially visualized ascites. Electronically Signed   By: Anner Crete M.D.   On: 08/28/2015 02:51   I have personally reviewed and evaluated these images and lab results as part of my medical decision-making.   EKG Interpretation None      MDM   Final diagnoses:  Hypoxia  Heart failure, unspecified heart failure chronicity, unspecified heart failure type (HCC)  Alcohol abuse, daily use  Schizophrenia, unspecified type (HCC)  IVC (inferior vena cava obstruction)    Patient has chronic respiratory acidosis, low O2 in the ED. Negative CT angio, he does have signs of heart failure with an elevated BNP. Patients oxygen level drops to 86% when I remove oxygen and would likely go down lower if I did not reinitiate. Pt is IVC'd and will need a psych consult. He is a daily drinker, will need CIWA protocol.  40 mg IV lasix ordered. Clinically no signs of pneumonia. Pt in no distress.  Admit, inpatient, [facility] ED, Dr. Tamala Julian, tele I personally performed the services described in this documentation, which was scribed in my presence. The recorded information has been reviewed and is accurate.  I personally performed the services described in this documentation, which was scribed in my presence. The recorded information has  been reviewed and is accurate.    Delos Haring, PA-C 08/28/15 [id number]  Harvel Quale, MD 08/28/15 [phone]

## 2015-08-27 NOTE — ED Notes (Signed)
Pt BIB sheriff. Pt's sister IVC'd pt due to pt stopping his medications. Paperwork states the pt has become increasingly paranoid and is danger to himself and others. Sheriff's deputy states the pt has been calm and cooperative since he arrived to pt's house.   Pt's O2 70% on RA while in triage. Pt denies shortness of breath, cough, respiratory issues at this time. Pt states he has been drinking beer today.

## 2015-08-28 ENCOUNTER — Inpatient Hospital Stay (HOSPITAL_COMMUNITY): Payer: Medicaid Other

## 2015-08-28 ENCOUNTER — Emergency Department (HOSPITAL_COMMUNITY): Payer: Medicaid Other

## 2015-08-28 DIAGNOSIS — I272 Other secondary pulmonary hypertension: Secondary | ICD-10-CM | POA: Diagnosis present

## 2015-08-28 DIAGNOSIS — I2609 Other pulmonary embolism with acute cor pulmonale: Secondary | ICD-10-CM | POA: Diagnosis not present

## 2015-08-28 DIAGNOSIS — Z046 Encounter for general psychiatric examination, requested by authority: Secondary | ICD-10-CM | POA: Diagnosis not present

## 2015-08-28 DIAGNOSIS — L97519 Non-pressure chronic ulcer of other part of right foot with unspecified severity: Secondary | ICD-10-CM | POA: Diagnosis present

## 2015-08-28 DIAGNOSIS — Z85828 Personal history of other malignant neoplasm of skin: Secondary | ICD-10-CM | POA: Diagnosis not present

## 2015-08-28 DIAGNOSIS — R188 Other ascites: Secondary | ICD-10-CM | POA: Diagnosis present

## 2015-08-28 DIAGNOSIS — Z8659 Personal history of other mental and behavioral disorders: Secondary | ICD-10-CM | POA: Diagnosis not present

## 2015-08-28 DIAGNOSIS — E876 Hypokalemia: Secondary | ICD-10-CM | POA: Diagnosis present

## 2015-08-28 DIAGNOSIS — I872 Venous insufficiency (chronic) (peripheral): Secondary | ICD-10-CM | POA: Diagnosis present

## 2015-08-28 DIAGNOSIS — J9601 Acute respiratory failure with hypoxia: Secondary | ICD-10-CM | POA: Diagnosis present

## 2015-08-28 DIAGNOSIS — E872 Acidosis: Secondary | ICD-10-CM | POA: Diagnosis present

## 2015-08-28 DIAGNOSIS — Z818 Family history of other mental and behavioral disorders: Secondary | ICD-10-CM | POA: Diagnosis not present

## 2015-08-28 DIAGNOSIS — D696 Thrombocytopenia, unspecified: Secondary | ICD-10-CM | POA: Diagnosis present

## 2015-08-28 DIAGNOSIS — I5031 Acute diastolic (congestive) heart failure: Secondary | ICD-10-CM | POA: Diagnosis not present

## 2015-08-28 DIAGNOSIS — J441 Chronic obstructive pulmonary disease with (acute) exacerbation: Secondary | ICD-10-CM | POA: Diagnosis present

## 2015-08-28 DIAGNOSIS — R0902 Hypoxemia: Secondary | ICD-10-CM | POA: Diagnosis present

## 2015-08-28 DIAGNOSIS — R748 Abnormal levels of other serum enzymes: Secondary | ICD-10-CM | POA: Diagnosis present

## 2015-08-28 DIAGNOSIS — I110 Hypertensive heart disease with heart failure: Secondary | ICD-10-CM | POA: Diagnosis not present

## 2015-08-28 DIAGNOSIS — I5033 Acute on chronic diastolic (congestive) heart failure: Secondary | ICD-10-CM | POA: Diagnosis present

## 2015-08-28 DIAGNOSIS — Z8042 Family history of malignant neoplasm of prostate: Secondary | ICD-10-CM | POA: Diagnosis not present

## 2015-08-28 DIAGNOSIS — Z79899 Other long term (current) drug therapy: Secondary | ICD-10-CM | POA: Diagnosis not present

## 2015-08-28 DIAGNOSIS — I2781 Cor pulmonale (chronic): Secondary | ICD-10-CM | POA: Diagnosis present

## 2015-08-28 DIAGNOSIS — Z880 Allergy status to penicillin: Secondary | ICD-10-CM | POA: Diagnosis not present

## 2015-08-28 DIAGNOSIS — J8410 Pulmonary fibrosis, unspecified: Secondary | ICD-10-CM | POA: Diagnosis present

## 2015-08-28 DIAGNOSIS — D751 Secondary polycythemia: Secondary | ICD-10-CM | POA: Diagnosis present

## 2015-08-28 DIAGNOSIS — I509 Heart failure, unspecified: Secondary | ICD-10-CM

## 2015-08-28 DIAGNOSIS — F209 Schizophrenia, unspecified: Secondary | ICD-10-CM

## 2015-08-28 DIAGNOSIS — Z9114 Patient's other noncompliance with medication regimen: Secondary | ICD-10-CM | POA: Diagnosis not present

## 2015-08-28 DIAGNOSIS — F17200 Nicotine dependence, unspecified, uncomplicated: Secondary | ICD-10-CM | POA: Diagnosis not present

## 2015-08-28 DIAGNOSIS — Z886 Allergy status to analgesic agent status: Secondary | ICD-10-CM | POA: Diagnosis not present

## 2015-08-28 DIAGNOSIS — F17210 Nicotine dependence, cigarettes, uncomplicated: Secondary | ICD-10-CM | POA: Diagnosis present

## 2015-08-28 LAB — URINALYSIS, ROUTINE W REFLEX MICROSCOPIC
BILIRUBIN URINE: NEGATIVE
Glucose, UA: NEGATIVE mg/dL
Hgb urine dipstick: NEGATIVE
KETONES UR: NEGATIVE mg/dL
Leukocytes, UA: NEGATIVE
NITRITE: NEGATIVE
PROTEIN: NEGATIVE mg/dL
Specific Gravity, Urine: 1.016 (ref 1.005–1.030)
pH: 6 (ref 5.0–8.0)

## 2015-08-28 LAB — BLOOD GAS, ARTERIAL
ACID-BASE EXCESS: 3.2 mmol/L — AB (ref 0.0–2.0)
Bicarbonate: 29.8 mEq/L — ABNORMAL HIGH (ref 20.0–24.0)
Drawn by: 232811
O2 CONTENT: 6 L/min
O2 Saturation: 88.8 %
PATIENT TEMPERATURE: 98.1
PCO2 ART: 53.4 mmHg — AB (ref 35.0–45.0)
PO2 ART: 61.1 mmHg — AB (ref 80.0–100.0)
TCO2: 25.5 mmol/L (ref 0–100)
pH, Arterial: 7.364 (ref 7.350–7.450)

## 2015-08-28 LAB — TSH: TSH: 2.534 u[IU]/mL (ref 0.350–4.500)

## 2015-08-28 LAB — TROPONIN I: Troponin I: 0.13 ng/mL — ABNORMAL HIGH (ref <0.031)

## 2015-08-28 LAB — CBC WITH DIFFERENTIAL/PLATELET
Basophils Absolute: 0.1 10*3/uL (ref 0.0–0.1)
Basophils Relative: 1 %
Eosinophils Absolute: 0.2 10*3/uL (ref 0.0–0.7)
Eosinophils Relative: 3 %
HCT: 55.6 % — ABNORMAL HIGH (ref 39.0–52.0)
Hemoglobin: 18.3 g/dL — ABNORMAL HIGH (ref 13.0–17.0)
LYMPHS ABS: 0.9 10*3/uL (ref 0.7–4.0)
LYMPHS PCT: 13 %
MCH: 31.1 pg (ref 26.0–34.0)
MCHC: 32.9 g/dL (ref 30.0–36.0)
MCV: 94.4 fL (ref 78.0–100.0)
Monocytes Absolute: 1.1 10*3/uL — ABNORMAL HIGH (ref 0.1–1.0)
Monocytes Relative: 16 %
NEUTROS PCT: 68 %
Neutro Abs: 4.6 10*3/uL (ref 1.7–7.7)
Platelets: 148 10*3/uL — ABNORMAL LOW (ref 150–400)
RBC: 5.89 MIL/uL — AB (ref 4.22–5.81)
RDW: 16 % — ABNORMAL HIGH (ref 11.5–15.5)
WBC: 6.7 10*3/uL (ref 4.0–10.5)

## 2015-08-28 LAB — RAPID URINE DRUG SCREEN, HOSP PERFORMED
AMPHETAMINES: NOT DETECTED
BARBITURATES: NOT DETECTED
Benzodiazepines: NOT DETECTED
Cocaine: NOT DETECTED
OPIATES: NOT DETECTED
TETRAHYDROCANNABINOL: NOT DETECTED

## 2015-08-28 MED ORDER — FUROSEMIDE 10 MG/ML IJ SOLN: 40.0000 mg | Freq: Two times a day (BID) | INTRAMUSCULAR | Status: DC

## 2015-08-28 MED ORDER — LORAZEPAM 2 MG/ML IJ SOLN: 1.0000 mg | Freq: Four times a day (QID) | INTRAMUSCULAR | Status: AC | PRN

## 2015-08-28 MED ORDER — ONDANSETRON HCL 4 MG/2ML IJ SOLN: 4.0000 mg | Freq: Four times a day (QID) | INTRAMUSCULAR | Status: DC | PRN

## 2015-08-28 MED ORDER — LISINOPRIL 20 MG PO TABS
30.0000 mg | ORAL_TABLET | Freq: Every day | ORAL | Status: DC
  Administered 2015-08-28: 30 mg via ORAL
  Filled 2015-08-28 (×3): qty 1

## 2015-08-28 MED ORDER — THIAMINE HCL 100 MG/ML IJ SOLN: 100.0000 mg | Freq: Every day | INTRAMUSCULAR | Status: DC

## 2015-08-28 MED ORDER — SODIUM CHLORIDE 0.9% FLUSH: 3.0000 mL | INTRAVENOUS | Status: DC | PRN

## 2015-08-28 MED ORDER — FOLIC ACID 1 MG PO TABS
1.0000 mg | ORAL_TABLET | Freq: Every day | ORAL | Status: DC
  Administered 2015-08-28 – 2015-09-01 (×3): 1 mg via ORAL
  Filled 2015-08-28 (×4): qty 1

## 2015-08-28 MED ORDER — VITAMIN B-1 100 MG PO TABS
100.0000 mg | ORAL_TABLET | Freq: Every day | ORAL | Status: DC
  Administered 2015-08-28 – 2015-09-01 (×3): 100 mg via ORAL
  Filled 2015-08-28 (×4): qty 1

## 2015-08-28 MED ORDER — SODIUM CHLORIDE 0.9 % IV SOLN: 250.0000 mL | INTRAVENOUS | Status: DC | PRN

## 2015-08-28 MED ORDER — FUROSEMIDE 10 MG/ML IJ SOLN
40.0000 mg | Freq: Once | INTRAMUSCULAR | Status: AC
Start: 2015-08-28 — End: 2015-08-28
  Administered 2015-08-28: 40 mg via INTRAVENOUS
  Filled 2015-08-28: qty 4

## 2015-08-28 MED ORDER — FLUPHENAZINE HCL 5 MG PO TABS
5.0000 mg | ORAL_TABLET | Freq: Two times a day (BID) | ORAL | Status: DC
  Administered 2015-08-28 – 2015-09-01 (×6): 5 mg via ORAL
  Filled 2015-08-28 (×10): qty 1

## 2015-08-28 MED ORDER — METHYLPREDNISOLONE SODIUM SUCC 125 MG IJ SOLR
125.0000 mg | INTRAMUSCULAR | Status: AC
  Administered 2015-08-28: 125 mg via INTRAVENOUS
  Filled 2015-08-28: qty 2

## 2015-08-28 MED ORDER — BENZTROPINE MESYLATE 0.5 MG PO TABS
0.5000 mg | ORAL_TABLET | Freq: Two times a day (BID) | ORAL | Status: DC
  Administered 2015-08-29 – 2015-09-01 (×5): 0.5 mg via ORAL
  Filled 2015-08-28 (×7): qty 1

## 2015-08-28 MED ORDER — DEXTROSE 5 % IV SOLN
8.0000 mg/h | INTRAVENOUS | Status: DC
Start: 2015-08-28 — End: 2015-08-30
  Administered 2015-08-28 – 2015-08-29 (×2): 8 mg/h via INTRAVENOUS
  Filled 2015-08-28: qty 25
  Filled 2015-08-28: qty 20
  Filled 2015-08-28: qty 25

## 2015-08-28 MED ORDER — ACETAMINOPHEN 325 MG PO TABS: 650.0000 mg | ORAL_TABLET | ORAL | Status: DC | PRN

## 2015-08-28 MED ORDER — FLUPHENAZINE DECANOATE 25 MG/ML IJ SOLN
25.0000 mg | Freq: Once | INTRAMUSCULAR | Status: AC
  Administered 2015-08-28: 25 mg via INTRAMUSCULAR
  Filled 2015-08-28: qty 1

## 2015-08-28 MED ORDER — VITAMIN D (ERGOCALCIFEROL) 1.25 MG (50000 UNIT) PO CAPS
50000.0000 [IU] | ORAL_CAPSULE | ORAL | Status: DC
  Filled 2015-08-28: qty 1

## 2015-08-28 MED ORDER — HALOPERIDOL LACTATE 5 MG/ML IJ SOLN: 2.0000 mg | Freq: Four times a day (QID) | INTRAMUSCULAR | Status: DC | PRN

## 2015-08-28 MED ORDER — SODIUM CHLORIDE 0.9% FLUSH
3.0000 mL | Freq: Two times a day (BID) | INTRAVENOUS | Status: DC
  Administered 2015-08-28 – 2015-08-31 (×4): 3 mL via INTRAVENOUS

## 2015-08-28 MED ORDER — ENOXAPARIN SODIUM 60 MG/0.6ML ~~LOC~~ SOLN
60.0000 mg | SUBCUTANEOUS | Status: DC
  Filled 2015-08-28 (×3): qty 0.6

## 2015-08-28 MED ORDER — FUROSEMIDE 10 MG/ML IJ SOLN: 80.0000 mg | Freq: Two times a day (BID) | INTRAMUSCULAR | Status: DC

## 2015-08-28 MED ORDER — IOPAMIDOL (ISOVUE-370) INJECTION 76%
100.0000 mL | Freq: Once | INTRAVENOUS | Status: AC | PRN
  Administered 2015-08-28: 100 mL via INTRAVENOUS

## 2015-08-28 MED ORDER — LORAZEPAM 1 MG PO TABS: 1.0000 mg | ORAL_TABLET | Freq: Four times a day (QID) | ORAL | Status: AC | PRN

## 2015-08-28 MED ORDER — IPRATROPIUM-ALBUTEROL 0.5-2.5 (3) MG/3ML IN SOLN
3.0000 mL | Freq: Two times a day (BID) | RESPIRATORY_TRACT | Status: DC
  Filled 2015-08-28 (×2): qty 3

## 2015-08-28 MED ORDER — ADULT MULTIVITAMIN W/MINERALS CH
1.0000 | ORAL_TABLET | Freq: Every day | ORAL | Status: DC
  Administered 2015-08-28 – 2015-09-01 (×3): 1 via ORAL
  Filled 2015-08-28 (×4): qty 1

## 2015-08-28 MED ORDER — IPRATROPIUM-ALBUTEROL 0.5-2.5 (3) MG/3ML IN SOLN
3.0000 mL | Freq: Four times a day (QID) | RESPIRATORY_TRACT | Status: DC
  Administered 2015-08-28: 3 mL via RESPIRATORY_TRACT
  Filled 2015-08-28 (×2): qty 3

## 2015-08-28 NOTE — H&P (Signed)
History and Physical    Robert Griffin [id number] DOB: 1955/10/04 DOA: 08/27/2015  Referring MD/NP/PA: Delos Haring, PA-C PCP: Ria Bush, MD  Patient coming from: home  Chief Complaint: Involuntary committed  HPI: TOR Robert Griffin is a 60 y.o. male with medical history significant of schizophrenia, skin cancer s/p resection, tobacco abuse, alcohol abuse, and diastolic dysfunction last EF 55-60% with grade 1 diastolic dysfunction in [id number]; who presents with after being involuntarily committed by family member for [place] himself in his home and not taking any of his medications. Patient is not a reliable historian and therefore most history is obtained per review of records. With the sheriff's department came to pick him up he was noted to be cooperative, calm, and admitted to stopping all medications. In talks with the patient he states that he does not have any difficulty breathing and can walk a couple blocks without any problems. He is on an day ago for schizophrenia but does not recall when his last dose was. He admits to having some abdominal swelling along withsome weight gain, but is unsure of how much. He chronically has bilateral lower extremity edema and notes a wound on the proximal aspect of the right foot. Review of records shows that he recently had a echocardiogram in 06/2015 by Dr. Ellyn Hack of cardiology. Due to elevated right atrial pressures was suspected that he had pulmonary artery hypertension for which they possibly plan on doing a right cardiac cath in the future. Denies any chest pain, dysuria, urinary frequency, has tendency, shortness of breath, palpitations, diarrhea, nausea, vomiting, or abdominal pain. He reports smoking cigarettes but does not quantify, and states that he drinks 8-10 beers a day on average.   ED Course: Upon arrival to the emergency department the patient was seen to have O2 sat generations as low as 70% on room air. O2 saturations improved  with nasal cannula oxygen. Lab work revealed hemoglobin 17.2, alcohol level undetectable, INR 1.26, BNP 432.9, and troponin i 0.06. Chest x-ray showed vascular congestion with mild cardiomegaly and signs of mild pulmonary edema with bilateral pleural effusions. CT angiogram was obtained which showed no acute signs of pulmonary emboli and partially visualized ascites.   Review of Systems: Complete review of systems does not appear reliable secondary to patient's current mental state  Past Medical History  Diagnosis Date  . History of schizophrenia     [facility]  . Skin cancer of trunk 2010    shoulder, BCC s/p derm removal  . Smoker     Chronic long-term smoker  . Hiatal hernia   . 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 Recently started on Lasix.  . Pulmonary hypertension ([facility]) 06/2015    by echo    Past Surgical History  Procedure Laterality Date  . Skin cancer excision  2011    on back  . Left wrist sugery  1980s    hand through glass, tendon reconstruction     reports that he has been smoking Cigarettes.  He has a 40 pack-year smoking history. He has never used smokeless tobacco. He reports that he drinks alcohol. He reports that he uses illicit drugs (Marijuana).  Allergies  Allergen Reactions  . Asa [Aspirin] Other (See Comments)    As a child unsure of reaction  . Penicillins Other (See Comments)    Pt cannot remember type of reaction Has patient had a PCN reaction causing immediate rash, facial/tongue/throat swelling, SOB or lightheadedness  with hypotension:unsure Has patient had a PCN reaction causing severe rash involving mucus membranes or skin necrosis:unsure Has patient had a PCN reaction that required hospitalization:unsure Has patient had a PCN reaction occurring within the last 10 years:does not think so If all of the above answers are "NO", then may proceed with Cephalosporin use.     Family History  Problem  Relation Age of Onset  . Cancer Mother     ?ovarian  . Cancer Father 46    prostate cancer  . Mental illness Paternal Grandfather   . Coronary artery disease Neg Hx   . Stroke Neg Hx   . Diabetes Neg Hx   . Hyperlipidemia Neg Hx   . Alcohol abuse Neg Hx   . Arthritis Neg Hx     Prior to Admission medications   Medication Sig Start Date End Date Taking? Authorizing Provider  acetaminophen (TYLENOL) 500 MG tablet Take 500 mg by mouth every 6 (six) hours as needed (for pain.).   Yes Historical Provider, MD  furosemide (LASIX) 40 MG tablet Take 40 mg by mouth 2 (two) times daily. 05/25/15  Yes Historical Provider, MD  ibuprofen (ADVIL,MOTRIN) 200 MG tablet Take 400 mg by mouth every 6 (six) hours as needed (for pain.).   Yes Historical Provider, MD  lisinopril (PRINIVIL,ZESTRIL) 30 MG tablet Take 30 mg by mouth daily. 05/25/15  Yes Historical Provider, MD  Paliperidone Palmitate (INVEGA SUSTENNA IM) Inject 1 Syringe into the muscle every 30 (thirty) days. One injection once a month   Yes Historical Provider, MD  Vitamin D, Ergocalciferol, (DRISDOL) 50000 units CAPS capsule Take 50,000 Units by mouth once a week. 07/26/15   Historical Provider, MD    Physical Exam:  Constitutional:Morbidly obese male who appears to be in no acute distress and is calm and cooperative.  Filed Vitals:   08/27/15 2314 08/27/15 2315 08/28/15 0040 08/28/15 0100  BP: 131/116 137/116 152/83 144/89  Pulse: 86 85 83 79  Temp:      TempSrc:      Resp: 24 23 18 21   SpO2: 93% 93% 95% 94%   Eyes: PERRL, lids and conjunctivae normal ENMT: Mucous membranes are moist. Posterior pharynx clear of any exudate or lesions.Normal dentition.  Neck: normal, supple, no masses, no thyromegaly Respiratory:Decreased overall aeration with intermittent crackles and mild expiratory wheezes appreciated.just mildly tachypneic on nasal cannula oxygen of 2 L. No accessory muscle use.  Cardiovascular: Regular rate and rhythm, no murmurs  / rubs / gallops.2+ pitting edema bilateral lower extremities. 2+ pedal pulses. No carotid bruits.  Abdomen:Abdominal distention noted, nontender to palpation, and bowel sounds positive.  Musculoskeletal: no clubbing / cyanosis. No joint deformity upper and lower extremities. Good ROM, no contractures. Normal muscle tone.  Skin: Venous stasis of the bilateral lower extremities and a small open wound of the proximal right foot  Neurologic: CN 2-12 grossly intact. Sensation intact, DTR normal. Strength 5/5 in all 4.  Psychiatric: Normal judgment and insight. Alert and oriented x 3. Normal mood.     Labs on Admission: I have personally reviewed following labs and imaging studies  CBC:  Recent Labs Lab 08/27/15 2255  WBC 8.4  NEUTROABS 5.6  HGB 17.2*  HCT 52.3*  MCV 92.6  PLT 0000000   Basic Metabolic Panel:  Recent Labs Lab 08/27/15 2255  NA 135  K 4.2  CL 101  CO2 27  GLUCOSE 89  BUN 7  CREATININE 0.72  CALCIUM 8.6*   GFR: CrCl cannot be  calculated (Unknown ideal weight.). Liver Function Tests:  Recent Labs Lab 08/27/15 2255  AST 28  ALT 18  ALKPHOS 55  BILITOT 0.9  PROT 6.7  ALBUMIN 3.1*   No results for input(s): LIPASE, AMYLASE in the last 168 hours. No results for input(s): AMMONIA in the last 168 hours. Coagulation Profile:  Recent Labs Lab 08/27/15 2312  INR 1.26   Cardiac Enzymes: No results for input(s): CKTOTAL, CKMB, CKMBINDEX, TROPONINI in the last 168 hours. BNP (last 3 results) No results for input(s): PROBNP in the last 8760 hours. HbA1C: No results for input(s): HGBA1C in the last 72 hours. CBG: No results for input(s): GLUCAP in the last 168 hours. Lipid Profile: No results for input(s): CHOL, HDL, LDLCALC, TRIG, CHOLHDL, LDLDIRECT in the last 72 hours. Thyroid Function Tests: No results for input(s): TSH, T4TOTAL, FREET4, T3FREE, THYROIDAB in the last 72 hours. Anemia Panel: No results for input(s): VITAMINB12, FOLATE, FERRITIN,  TIBC, IRON, RETICCTPCT in the last 72 hours. Urine analysis: No results found for: COLORURINE, APPEARANCEUR, LABSPEC, PHURINE, GLUCOSEU, HGBUR, BILIRUBINUR, KETONESUR, PROTEINUR, UROBILINOGEN, NITRITE, LEUKOCYTESUR Sepsis Labs: No results found for this or any previous visit (from the past 240 hour(s)).   Radiological Exams on Admission: Dg Chest 2 View  08/27/2015  CLINICAL DATA:  Acute onset of hypoxia and cough. Initial encounter. EXAM: CHEST  2 VIEW COMPARISON:  Chest radiograph from 05/30/2015 FINDINGS: The lungs are well-aerated. Vascular congestion is noted. Increased interstitial markings raise concern for mild pulmonary edema. Small bilateral pleural effusions are seen. No pneumothorax is seen. The cardiomediastinal silhouette is mildly enlarged. No acute osseous abnormalities are identified. IMPRESSION: Vascular congestion and mild cardiomegaly. Increased interstitial markings raise concern for mild pulmonary edema. Small bilateral pleural effusions seen. Electronically Signed   By: Garald Balding M.D.   On: 08/27/2015 23:39   Ct Angio Chest Pe W/cm &/or Wo Cm  08/28/2015  CLINICAL DATA:  60 year old male with hypoxia and shortness of breath EXAM: CT ANGIOGRAPHY CHEST WITH CONTRAST TECHNIQUE: Multidetector CT imaging of the chest was performed using the standard protocol during bolus administration of intravenous contrast. Multiplanar CT image reconstructions and MIPs were obtained to evaluate the vascular anatomy. CONTRAST:  100 cc Isovue 370 COMPARISON:  Chest radiograph dated 08/27/2015 FINDINGS: Evaluation of this exam is limited due to respiratory motion artifact. There is emphysematous changes of the lungs with chronic interstitial coarsening. There is no focal consolidation, pleural effusion, or pneumothorax. The central airways are patent. The thoracic aorta appears unremarkable. The origins of the great vessels of the aortic arch appear patent. No CT evidence of pulmonary embolism.  Top-normal cardiac size. No pericardial effusion. There is coronary vascular calcification. Bilateral hilar adenopathy of indeterminate clinical significance. Although these may be reactive or related to underlying granulomatous disease, the possibility of metastatic disease is not excluded. Clinical correlation is recommended. The esophagus is grossly unremarkable. No thyroid nodules identified. There is no axillary adenopathy. There is diffuse subcutaneous stranding and edema of the lower chest wall. There is mild degenerative changes of the spine. No acute fracture. Partially visualized small amount of ascites in the upper abdomen. An ill-defined linear 5.3 cm hypodensity in the right lobe of the liver is not well characterized but may represent vascular structure. Review of the MIP images confirms the above findings. IMPRESSION: No CT evidence of pulmonary embolism. Emphysema with chronic interstitial scarring. Bilateral hilar adenopathy of indeterminate clinical significance. Clinical correlation is recommended new Partially visualized ascites. Electronically Signed   By: Milas Hock  Radparvar M.D.   On: 08/28/2015 02:51    EKG: Independently reviewed. Sinus rhythm with rightward axis  Assessment/Plan Acute exacerbation diastolic congestive heart failure: Patient found to be hypoxic on room air on admission with elevated BNP of 432.9 and chest x-ray showing signs of vascular congestion. Recent echocardiogram from 06/2015 shows EF of 55-60% with grade 1 diastolic dysfunction. Patient was given 40 mg of IV Lasix in the ED. - Admit to a telemetry bed - Strict ins and outs and daily weights - Lasix 40 mg IV twice a day, reassess and change dosage as needed - Compression stockings for lower extremity edema - May warrant notifying patient's cardiologist Dr. Ellyn Hack  - Aspirin not ordered as patient has a allergy  Acute respiratory failure with hypoxia/ COPD exacerbation: Patient with negative CT angiogram of  the chest or any signs of PE. - Continuous pulse oximetry with nasal cannula oxygen to keep O2 sats greater than 90% - DuoNeb's QID  - Solumedrol 125mg  x 1 dose now  Involuntary commitment secondary schizophrenia and noncompliance with medications - Sitter to bedside   - Will need to have inpatient formal psychiatry consult  Polycythemia: Hemoglobin previously 16 -18 present since 2009 - Question if this is ever been worked up in the past.  Essential hypertension - Continue lisinopril   Tobacco and Alcohol abuse history: Patient reports drinking anywhere from 1-8 beers per day - CWIAA - Patient declined nicotine patch  DVT prophylaxis: Lovenox    Code Status: Full Family Communication:  no family at bedside  Disposition Plan: Undetermined Consults called: None Admission status: Inpatient telemetry  Norval Morton MD [facility] Pager [phone]  If 7PM-7AM, please contact night-coverage [email or web address] Password [email or web address]  08/28/2015, 3:22 AM

## 2015-08-28 NOTE — ED Notes (Signed)
Pt refusing to sit back down to received IV, stating he is still trying to pee. After multiple attempts to redirect, security notified.

## 2015-08-28 NOTE — Consult Note (Signed)
[facility] Face-to-Face Psychiatry Consult   Reason for Consult:  Schizophrenia Referring Physician:  Dr. Venetia Constable Patient Identification: Robert Griffin MRN:  [id number] Principal Diagnosis: Acute diastolic (congestive) heart failure [facility]) Diagnosis:   Patient Active Problem List   Diagnosis Date Noted  . Heart failure ([place]) [I50.9] 08/28/2015  . Acute diastolic (congestive) heart failure ([place]) [I50.31] 08/28/2015  . Involuntary commitment [Z04.6] 08/28/2015  . Acute respiratory failure with hypoxia ([place]) [J96.01] 08/28/2015  . Pain in the chest [R07.9] 06/10/2015  . Abnormal EKG [R94.31] 06/10/2015  . Medical certificate issuance [Z02.79] 04/04/2012  . SCHIZOPHRENIA, HX OF [Z86.59] 07/01/2008  . CARCINOMA, BASAL CELL [C44.90] 05/06/2008  . TOBACCO ABUSE [F17.200] 04/19/2008  . Varicose veins of lower extremity with edema [I83.899] 04/19/2008    Total Time spent with patient: 30 minutes  Subjective:   Robert Griffin is a 60 y.o. male patient admitted with Congestive heart failure and schizophrenia.  HPI:  This patient is a 60 year old single white male who lives alone in [place]. He is a poor historian but his sister Gwinda Passe was contacted for [place] to this information.  Apparently the patient has had a history of schizophrenia since he was approximately 60 years old. He began with auditory hallucinations paranoia and delusions. He has been on antipsychotics most of his life. He is followed at [facility] and was getting Prolixin injections and did well for number of years. About 2 years ago he was switched to [place] injections. His sister said that this medication did not work for him and he became increasingly psychotic. In April he stopped going to [facility] to get his shots altogether. Since then he has deteriorated. He has not been taking either his psychiatric nor his other medications. He is been barricading himself in his house and is very paranoid. He has covers over the doors and even the  TV. He has been calling the sheriff stating that people are stealing his things. He hears voices and sees things. His mental illness has affected his congestive heart failure as he is not taking his medicine and has developed hypoxia. His sister finally filed involuntary petition for commitment.  Today the patient is lying in bed and makes poor eye contact. His abdomen is quite distended. He does have ascites and drinks fairly heavily, at least 6 beers a day. He also admits to smoking marijuana but his urine drug screen is negative. He is irritable but denies auditory or visual hallucinations at the moment. He denies being paranoid. He's primarily tired and doesn't want to talk. He denies any thoughts of wanting to hurt self or others.  Past Psychiatric History: Long term treatment for schizophrenia, no recent hospitalizations  Risk to Self: Is patient at risk for suicide?: No, but patient needs Medical Clearance Risk to Others:   Prior Inpatient Therapy:   Prior Outpatient Therapy:    Past Medical History:  Past Medical History  Diagnosis Date  . History of schizophrenia     [facility]  . Skin cancer of trunk 2010    shoulder, BCC s/p derm removal  . Smoker     Chronic long-term smoker  . Hiatal hernia   . 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 Recently started on Lasix.  . Pulmonary hypertension ([place]) 06/2015    by echo    Past Surgical History  Procedure Laterality Date  . Skin cancer excision  2011    on back  . Left wrist  sugery  1980s    hand through glass, tendon reconstruction   Family History:  Family History  Problem Relation Age of Onset  . Cancer Mother     ?ovarian  . Cancer Father 55    prostate cancer  . Mental illness Paternal Grandfather   . Coronary artery disease Neg Hx   . Stroke Neg Hx   . Diabetes Neg Hx   . Hyperlipidemia Neg Hx   . Alcohol abuse Neg Hx   . Arthritis Neg Hx    Family Psychiatric   History: Paternal grandfather had schizophrenia and died in a psychiatric facility Social History:  History  Alcohol Use  . 0.0 oz/week  . 0 Standard drinks or equivalent per week    Comment: (6-12 beers/week)     History  Drug Use  . Yes  . Special: Marijuana    Comment: MJ    Social History   Social History  . Marital Status: Single    Spouse Name: N/A  . Number of Children: N/A  . Years of Education: N/A   Occupational History  . unemployed    Social History Main Topics  . Smoking status: Current Every Day Smoker -- 1.00 packs/day for 40 years    Types: Cigarettes  . Smokeless tobacco: Never Used     Comment: He rolls his own cigarettes - both tobacco and marijuana. No interest in stopping  . Alcohol Use: 0.0 oz/week    0 Standard drinks or equivalent per week     Comment: (6-12 beers/week)  . Drug Use: Yes    Special: Marijuana     Comment: MJ  . Sexual Activity: Not Asked   Other Topics Concern  . None   Social History Narrative   Caffeine: 2 cups coffee, 3 cups tea daily   Lives alone, 1 dog   Family in area - sister brought him today. Has 3 brothers and 3 sisters   Occupation: unemployed, was painting   9th grade education   Activity: no regular exercise   Diet: likes vegetables, good amt water         Additional Social History:    Allergies:   Allergies  Allergen Reactions  . Asa [Aspirin] Other (See Comments)    As a child unsure of reaction  . Penicillins Other (See Comments)    Pt cannot remember type of reaction Has patient had a PCN reaction causing immediate rash, facial/tongue/throat swelling, SOB or lightheadedness with hypotension:unsure Has patient had a PCN reaction causing severe rash involving mucus membranes or skin necrosis:unsure Has patient had a PCN reaction that required hospitalization:unsure Has patient had a PCN reaction occurring within the last 10 years:does not think so If all of the above answers are "NO", then may  proceed with Cephalosporin use.     Labs:  Results for orders placed or performed during the hospital encounter of 08/27/15 (from the past 48 hour(s))  Brain natriuretic peptide     Status: Abnormal   Collection Time: 08/27/15 10:55 PM  Result Value Ref Range   B Natriuretic Peptide 432.9 (H) 0.0 - 100.0 pg/mL  CBC with Differential/Platelet     Status: Abnormal   Collection Time: 08/27/15 10:55 PM  Result Value Ref Range   WBC 8.4 4.0 - 10.5 K/uL   RBC 5.65 4.22 - 5.81 MIL/uL   Hemoglobin 17.2 (H) 13.0 - 17.0 g/dL   HCT 52.3 (H) 39.0 - 52.0 %   MCV 92.6 78.0 - 100.0 fL  MCH 30.4 26.0 - 34.0 pg   MCHC 32.9 30.0 - 36.0 g/dL   RDW 15.6 (H) 11.5 - 15.5 %   Platelets 160 150 - 400 K/uL   Neutrophils Relative % 66 %   Neutro Abs 5.6 1.7 - 7.7 K/uL   Lymphocytes Relative 16 %   Lymphs Abs 1.4 0.7 - 4.0 K/uL   Monocytes Relative 14 %   Monocytes Absolute 1.1 (H) 0.1 - 1.0 K/uL   Eosinophils Relative 3 %   Eosinophils Absolute 0.3 0.0 - 0.7 K/uL   Basophils Relative 1 %   Basophils Absolute 0.0 0.0 - 0.1 K/uL  Comprehensive metabolic panel     Status: Abnormal   Collection Time: 08/27/15 10:55 PM  Result Value Ref Range   Sodium 135 135 - 145 mmol/L   Potassium 4.2 3.5 - 5.1 mmol/L   Chloride 101 101 - 111 mmol/L   CO2 27 22 - 32 mmol/L   Glucose, Bld 89 65 - 99 mg/dL   BUN 7 6 - 20 mg/dL   Creatinine, Ser 0.72 0.61 - 1.24 mg/dL   Calcium 8.6 (L) 8.9 - 10.3 mg/dL   Total Protein 6.7 6.5 - 8.1 g/dL   Albumin 3.1 (L) 3.5 - 5.0 g/dL   AST 28 15 - 41 U/L   ALT 18 17 - 63 U/L   Alkaline Phosphatase 55 38 - 126 U/L   Total Bilirubin 0.9 0.3 - 1.2 mg/dL   GFR calc non Af Amer >60 >60 mL/min   GFR calc Af Amer >60 >60 mL/min    Comment: (NOTE) The eGFR has been calculated using the CKD EPI equation. This calculation has not been validated in all clinical situations. eGFR's persistently <60 mL/min signify possible Chronic Kidney Disease.    Anion gap 7 5 - 15  Ethanol      Status: None   Collection Time: 08/27/15 11:09 PM  Result Value Ref Range   Alcohol, Ethyl (B) <5 <5 mg/dL    Comment:        LOWEST DETECTABLE LIMIT FOR SERUM ALCOHOL IS 5 mg/dL FOR MEDICAL PURPOSES ONLY   Protime-INR     Status: Abnormal   Collection Time: 08/27/15 11:12 PM  Result Value Ref Range   Prothrombin Time 15.9 (H) 11.6 - 15.2 seconds   INR 1.26 0.00 - 1.49  I-stat troponin, ED     Status: None   Collection Time: 08/27/15 11:17 PM  Result Value Ref Range   Troponin i, poc 0.06 0.00 - 0.08 ng/mL   Comment 3            Comment: Due to the release kinetics of cTnI, a negative result within the first hours of the onset of symptoms does not rule out myocardial infarction with certainty. If myocardial infarction is still suspected, repeat the test at appropriate intervals.   Blood gas, arterial ([facility] & [facility] ONLY)     Status: Abnormal   Collection Time: 08/28/15 12:35 AM  Result Value Ref Range   O2 Content 6.0 L/min   Delivery systems NASAL CANNULA    pH, Arterial 7.364 7.350 - 7.450   pCO2 arterial 53.4 (H) 35.0 - 45.0 mmHg   pO2, Arterial 61.1 (L) 80.0 - 100.0 mmHg   Bicarbonate 29.8 (H) 20.0 - 24.0 mEq/L   TCO2 25.5 0 - 100 mmol/L   Acid-Base Excess 3.2 (H) 0.0 - 2.0 mmol/L   O2 Saturation 88.8 %   Patient temperature 98.1    Collection site  RIGHT RADIAL    Drawn by [id number]    Sample type ARTERIAL    Allens test (pass/fail) PASS PASS  Urine rapid drug screen (hosp performed)     Status: None   Collection Time: 08/28/15  3:50 AM  Result Value Ref Range   Opiates NONE DETECTED NONE DETECTED   Cocaine NONE DETECTED NONE DETECTED   Benzodiazepines NONE DETECTED NONE DETECTED   Amphetamines NONE DETECTED NONE DETECTED   Tetrahydrocannabinol NONE DETECTED NONE DETECTED   Barbiturates NONE DETECTED NONE DETECTED    Comment:        DRUG SCREEN FOR MEDICAL PURPOSES ONLY.  IF CONFIRMATION IS NEEDED FOR ANY PURPOSE, NOTIFY LAB WITHIN 5 DAYS.        LOWEST DETECTABLE  LIMITS FOR URINE DRUG SCREEN Drug Class       Cutoff (ng/mL) Amphetamine      1000 Barbiturate      200 Benzodiazepine   315 Tricyclics       176 Opiates          300 Cocaine          300 THC              50   Urinalysis, Routine w reflex microscopic (not at [facility])     Status: None   Collection Time: 08/28/15  3:50 AM  Result Value Ref Range   Color, Urine YELLOW YELLOW   APPearance CLEAR CLEAR   Specific Gravity, Urine 1.016 1.005 - 1.030   pH 6.0 5.0 - 8.0   Glucose, UA NEGATIVE NEGATIVE mg/dL   Hgb urine dipstick NEGATIVE NEGATIVE   Bilirubin Urine NEGATIVE NEGATIVE   Ketones, ur NEGATIVE NEGATIVE mg/dL   Protein, ur NEGATIVE NEGATIVE mg/dL   Nitrite NEGATIVE NEGATIVE   Leukocytes, UA NEGATIVE NEGATIVE    Comment: MICROSCOPIC NOT DONE ON URINES WITH NEGATIVE PROTEIN, BLOOD, LEUKOCYTES, NITRITE, OR GLUCOSE <1000 mg/dL.  TSH     Status: None   Collection Time: 08/28/15  4:50 AM  Result Value Ref Range   TSH 2.534 0.350 - 4.500 uIU/mL  CBC WITH DIFFERENTIAL     Status: Abnormal   Collection Time: 08/28/15  4:50 AM  Result Value Ref Range   WBC 6.7 4.0 - 10.5 K/uL   RBC 5.89 (H) 4.22 - 5.81 MIL/uL   Hemoglobin 18.3 (H) 13.0 - 17.0 g/dL   HCT 55.6 (H) 39.0 - 52.0 %   MCV 94.4 78.0 - 100.0 fL   MCH 31.1 26.0 - 34.0 pg   MCHC 32.9 30.0 - 36.0 g/dL   RDW 16.0 (H) 11.5 - 15.5 %   Platelets 148 (L) 150 - 400 K/uL   Neutrophils Relative % 68 %   Neutro Abs 4.6 1.7 - 7.7 K/uL   Lymphocytes Relative 13 %   Lymphs Abs 0.9 0.7 - 4.0 K/uL   Monocytes Relative 16 %   Monocytes Absolute 1.1 (H) 0.1 - 1.0 K/uL   Eosinophils Relative 3 %   Eosinophils Absolute 0.2 0.0 - 0.7 K/uL   Basophils Relative 1 %   Basophils Absolute 0.1 0.0 - 0.1 K/uL  Troponin I     Status: Abnormal   Collection Time: 08/28/15  4:50 AM  Result Value Ref Range   Troponin I 0.13 (H) <0.031 ng/mL    Comment:        PERSISTENTLY INCREASED TROPONIN VALUES IN THE RANGE OF 0.04-0.49 ng/mL CAN BE SEEN IN:        -UNSTABLE ANGINA       -  CONGESTIVE HEART FAILURE       -MYOCARDITIS       -CHEST TRAUMA       -ARRYHTHMIAS       -LATE PRESENTING MYOCARDIAL INFARCTION       -COPD   CLINICAL FOLLOW-UP RECOMMENDED.     Current Facility-Administered Medications  Medication Dose Route Frequency Provider Last Rate Last Dose  . 0.9 %  sodium chloride infusion  250 mL Intravenous PRN Norval Morton, MD      . acetaminophen (TYLENOL) tablet 650 mg  650 mg Oral Q4H PRN Rondell A Tamala Julian, MD      . enoxaparin (LOVENOX) injection 60 mg  60 mg Subcutaneous Q24H Rondell A Tamala Julian, MD   60 mg at 08/28/15 1007  . folic acid (FOLVITE) tablet 1 mg  1 mg Oral Daily Rondell A Tamala Julian, MD   1 mg at 08/28/15 1007  . furosemide (LASIX) 250 mg in dextrose 5 % 250 mL (1 mg/mL) infusion  8 mg/hr Intravenous Continuous Charlynne Cousins, MD 8 mL/hr at 08/28/15 1008 8 mg/hr at 08/28/15 1008  . haloperidol lactate (HALDOL) injection 2 mg  2 mg Intravenous Q6H PRN Charlynne Cousins, MD      . ipratropium-albuterol (DUONEB) 0.5-2.5 (3) MG/3ML nebulizer solution 3 mL  3 mL Nebulization QID Norval Morton, MD   3 mL at 08/28/15 0425  . lisinopril (PRINIVIL,ZESTRIL) tablet 30 mg  30 mg Oral Daily Norval Morton, MD   30 mg at 08/28/15 1006  . LORazepam (ATIVAN) tablet 1 mg  1 mg Oral Q6H PRN Norval Morton, MD       Or  . LORazepam (ATIVAN) injection 1 mg  1 mg Intravenous Q6H PRN Norval Morton, MD      . multivitamin with minerals tablet 1 tablet  1 tablet Oral Daily Norval Morton, MD   1 tablet at 08/28/15 1005  . ondansetron (ZOFRAN) injection 4 mg  4 mg Intravenous Q6H PRN Rondell A Tamala Julian, MD      . sodium chloride flush (NS) 0.9 % injection 3 mL  3 mL Intravenous Q12H Norval Morton, MD   3 mL at 08/28/15 1126  . sodium chloride flush (NS) 0.9 % injection 3 mL  3 mL Intravenous PRN Norval Morton, MD      . thiamine (VITAMIN B-1) tablet 100 mg  100 mg Oral Daily Rondell A Tamala Julian, MD   100 mg at 08/28/15 1005   Or  .  thiamine (B-1) injection 100 mg  100 mg Intravenous Daily Norval Morton, MD      . Derrill Memo ON 08/29/2015] Vitamin D (Ergocalciferol) (DRISDOL) capsule 50,000 Units  50,000 Units Oral Weekly Norval Morton, MD        Musculoskeletal: Strength & Muscle Tone: within normal limits Gait & Station: unsteady Patient leans: N/A  Psychiatric Specialty Exam: Physical Exam  Review of Systems  Respiratory: Positive for shortness of breath.   Psychiatric/Behavioral: Positive for memory loss. The patient is nervous/anxious.     Blood pressure 121/79, pulse 98, temperature 98 F (36.7 C), temperature source Oral, resp. rate 20, height _0  (1.778 m), weight 129.275 kg (285 lb), SpO2 100 %.Body mass index is 40.89 kg/(m^2).  General Appearance: Disheveled  Eye Contact:  Poor  Speech:  Slow  Volume:  Decreased  Mood:  Irritable  Affect:  Flat  Thought Process:  Coherent  Orientation:  Full (Time, Place, and Person)  Thought Content:  Hallucinations:  Auditory delusions and paranoia per sister   Suicidal Thoughts:  No  Homicidal Thoughts:  No  Memory:  Immediate;   Poor Recent;   Poor Remote;   Poor  Judgement:  Impaired  Insight:  Lacking  Psychomotor Activity:  Decreased  Concentration:  Concentration: Poor and Attention Span: Poor  Recall:  Poor  Fund of Knowledge:  Fair  Language:  Fair  Akathisia:  No  Handed:  Right  AIMS (if indicated):     Assets:  Armed forces logistics/support/administrative officer Social Support  ADL's:  Intact  Cognition:  WNL  Sleep:        Treatment Plan Summary: Daily contact with patient to assess and evaluate symptoms and progress in treatment and Medication management  Disposition: Recommend psychiatric Inpatient admission when medically cleared.  Will restart Prolixin [place]. Since this take some time to work we will also start Prolixin by mouth along with Cogentin he may stabilize enough to go home while he is here or he may need inpatient admission. We'll continue to  follow  Levonne Spiller, MD 08/28/2015 3:39 PM

## 2015-08-28 NOTE — Progress Notes (Signed)
[facility] PROGRESS NOTE    Progress Note  [name]  [id number] DOB: [birth year] DOA: 08/27/2015 PCP: Ria Bush, MD     Brief Narrative:   Robert Griffin is an 60 y.o. male   Assessment/Plan:   Acute respiratory failure with hypoxia ([place]) due to Acute diastolic (congestive) heart failure ([place]) I agree he needs IV Lasix, increase his Lasix to an infusion. Last time when he saw his cardiologist as estimated dry weight was 112 kg on admission he is 129. Continue standing daily weights and fluid restriction. Monitor electrolytes and replete as needed. His abdomen is massively distended with the protruded [place] will get an abdominal ultrasound.  TOBACCO ABUSE/alcohol use: Continue to monitor with [facility]WA  SCHIZOPHRENIA, HX OF/Involuntary commitment secondary to schizophrenia and noncompliance with his medication: I will consult psychiatry is Haldol when necessary for agitation.    Polycythemia: Is been worked in the past, present since 2009.  Essential hypertension: Continue lisinopril.  Stage II right foot ulcer: Consult wound care.  DVT prophylaxis: lovenox Family Communication:none Disposition Plan/Barrier to D/C: unable to determine Code Status:     Code Status Orders        Start     Ordered   08/28/15 0411  Full code   Continuous     08/28/15 0413    Code Status History    Date Active Date Inactive Code Status Order ID Comments User Context   This patient has a current code status but no historical code status.        IV Access:    Peripheral IV   Procedures and diagnostic studies:   Dg Chest 2 View  08/27/2015  CLINICAL DATA:  Acute onset of hypoxia and cough. Initial encounter. EXAM: CHEST  2 VIEW COMPARISON:  Chest radiograph from 05/30/2015 FINDINGS: The lungs are well-aerated. Vascular congestion is noted. Increased interstitial markings raise concern for mild pulmonary edema. Small bilateral pleural effusions are seen. No  pneumothorax is seen. The cardiomediastinal silhouette is mildly enlarged. No acute osseous abnormalities are identified. IMPRESSION: Vascular congestion and mild cardiomegaly. Increased interstitial markings raise concern for mild pulmonary edema. Small bilateral pleural effusions seen. Electronically Signed   By: Garald Balding M.D.   On: 08/27/2015 23:39   Ct Angio Chest Pe W/cm &/or Wo Cm  08/28/2015  CLINICAL DATA:  60 year old male with hypoxia and shortness of breath EXAM: CT ANGIOGRAPHY CHEST WITH CONTRAST TECHNIQUE: Multidetector CT imaging of the chest was performed using the standard protocol during bolus administration of intravenous contrast. Multiplanar CT image reconstructions and MIPs were obtained to evaluate the vascular anatomy. CONTRAST:  100 cc Isovue 370 COMPARISON:  Chest radiograph dated 08/27/2015 FINDINGS: Evaluation of this exam is limited due to respiratory motion artifact. There is emphysematous changes of the lungs with chronic interstitial coarsening. There is no focal consolidation, pleural effusion, or pneumothorax. The central airways are patent. The thoracic aorta appears unremarkable. The origins of the great vessels of the aortic arch appear patent. No CT evidence of pulmonary embolism. Top-normal cardiac size. No pericardial effusion. There is coronary vascular calcification. Bilateral hilar adenopathy of indeterminate clinical significance. Although these may be reactive or related to underlying granulomatous disease, the possibility of metastatic disease is not excluded. Clinical correlation is recommended. The esophagus is grossly unremarkable. No thyroid nodules identified. There is no axillary adenopathy. There is diffuse subcutaneous stranding and edema of the lower chest wall. There is mild degenerative changes of the spine. No acute fracture. Partially visualized  small amount of ascites in the upper abdomen. An ill-defined linear 5.3 cm hypodensity in the right lobe  of the liver is not well characterized but may represent vascular structure. Review of the MIP images confirms the above findings. IMPRESSION: No CT evidence of pulmonary embolism. Emphysema with chronic interstitial scarring. Bilateral hilar adenopathy of indeterminate clinical significance. Clinical correlation is recommended new Partially visualized ascites. Electronically Signed   By: Anner Crete M.D.   On: 08/28/2015 02:51     Medical Consultants:    None.  Anti-Infectives:   none  Subjective:    Robert Griffin no complaints, poor insight on medical condition. He does not know when he stopped taking his medications  Objective:    Filed Vitals:   08/28/15 0330 08/28/15 0340 08/28/15 0415 08/28/15 0804  BP: 140/94  157/99   Pulse:  87 84   Temp:   97.8 F (36.6 C)   TempSrc:   Oral   Resp:  26 20   Height:   5\' 10"  (1.778 m)   Weight:   129.275 kg (285 lb)   SpO2:  90% 95% 90%    Intake/Output Summary (Last 24 hours) at 08/28/15 0828 Last data filed at 08/28/15 0810  Gross per 24 hour  Intake    240 ml  Output    650 ml  Net   -410 ml   Filed Weights   08/28/15 0415  Weight: 129.275 kg (285 lb)    Exam: General exam: In no acute distress.Morbidly obese Respiratory system: Good air movement and clear to auscultation. Cardiovascular system: S1 & S2 heard, RRR.  Gastrointestinal system: Massively distended abdomen with protrusion of the unable soft but nontender Central nervous system: Alert and oriented. No focal neurological deficits. Extremities: 3+ woody edema Skin: No rashes, lesions or ulcers Psychiatry: Poor insight of medical condition.   Data Reviewed:    Labs: Basic Metabolic Panel:  Recent Labs Lab 08/27/15 2255  NA 135  K 4.2  CL 101  CO2 27  GLUCOSE 89  BUN 7  CREATININE 0.72  CALCIUM 8.6*   GFR Estimated Creatinine Clearance: 136 mL/min (by C-G formula based on Cr of 0.72). Liver Function Tests:  Recent Labs Lab  08/27/15 2255  AST 28  ALT 18  ALKPHOS 55  BILITOT 0.9  PROT 6.7  ALBUMIN 3.1*   No results for input(s): LIPASE, AMYLASE in the last 168 hours. No results for input(s): AMMONIA in the last 168 hours. Coagulation profile  Recent Labs Lab 08/27/15 2312  INR 1.26    CBC:  Recent Labs Lab 08/27/15 2255 08/28/15 0450  WBC 8.4 6.7  NEUTROABS 5.6 4.6  HGB 17.2* 18.3*  HCT 52.3* 55.6*  MCV 92.6 94.4  PLT 160 148*   Cardiac Enzymes:  Recent Labs Lab 08/28/15 0450  TROPONINI 0.13*   BNP (last 3 results) No results for input(s): PROBNP in the last 8760 hours. CBG: No results for input(s): GLUCAP in the last 168 hours. D-Dimer: No results for input(s): DDIMER in the last 72 hours. Hgb A1c: No results for input(s): HGBA1C in the last 72 hours. Lipid Profile: No results for input(s): CHOL, HDL, LDLCALC, TRIG, CHOLHDL, LDLDIRECT in the last 72 hours. Thyroid function studies:  Recent Labs  08/28/15 0450  TSH 2.534   Anemia work up: No results for input(s): VITAMINB12, FOLATE, FERRITIN, TIBC, IRON, RETICCTPCT in the last 72 hours. Sepsis Labs:  Recent Labs Lab 08/27/15 2255 08/28/15 0450  WBC 8.4 6.7  Microbiology No results found for this or any previous visit (from the past 240 hour(s)).   Medications:   . enoxaparin (LOVENOX) injection  60 mg Subcutaneous Q24H  . folic acid  1 mg Oral Daily  . furosemide  40 mg Intravenous BID  . ipratropium-albuterol  3 mL Nebulization QID  . lisinopril  30 mg Oral Daily  . multivitamin with minerals  1 tablet Oral Daily  . sodium chloride flush  3 mL Intravenous Q12H  . thiamine  100 mg Oral Daily   Or  . thiamine  100 mg Intravenous Daily  . [START ON 08/29/2015] Vitamin D (Ergocalciferol)  50,000 Units Oral Weekly   Continuous Infusions:   Time spent: 25 min   LOS: 0 days   Charlynne Cousins  [facility] Pager [phone]  *Please refer to [email or web address], password [email or web address] to get updated schedule on  who will round on this patient, as hospitalists switch teams weekly. If 7PM-7AM, please contact night-coverage at [email or web address], password [email or web address] for any overnight needs.  08/28/2015, 8:28 AM

## 2015-08-28 NOTE — ED Notes (Signed)
Pt removed [place], sats dropped to 85% on RA, pt placed back on [place] and educated on the importance of oxygen. Pt sats up to 92%

## 2015-08-28 NOTE — Progress Notes (Signed)
Lovenox per Pharmacy for DVT Prophylaxis    Pharmacy has been consulted from dosing enoxaparin (lovenox) in this patient for DVT prophylaxis.  The pharmacist has reviewed pertinent labs (Hgb _17.2__; PLT_160__), patient weight (_129__kg) and renal function (CrCl_>90__mL/min) and decided that enoxaparin _60_mg SQ Q_24_Hrs is appropriate for this patient.  The pharmacy department will sign off at this time.  Please reconsult pharmacy if status changes or for further issues.  Thank you  Cyndia Diver PharmD, BCPS  08/28/2015, 4:35 AM

## 2015-08-28 NOTE — Clinical Social Work Psych Assess (Addendum)
Clinical Social Work Nature conservation officer  Clinical Social Worker:  [place], [place] Date/Time:  08/28/2015, 2:30 PM Referred By:  Physician Date Referred:  08/28/15 Reason for Referral:  Crisis Intervention (Patient has refused and stopped his medications since April 2017)   Presenting Symptoms/Problems  Presenting Symptoms/Problems(in person's/family's own words):  Assessment completed with patient sister who completed IVC in community. Patient has stopped all his medications since April 2017 with reports "they are not working".  Since stopping patient has increased AVH, paranoia, and not acting himself. Sister reports he is a danger to self and others, needs to be placed back on medications for stability.    Abuse/Neglect/Trauma History  Abuse/Neglect/Trauma History:  Denies History Abuse/Neglect/Trauma History Comments (indicate dates):  NA   Psychiatric History  Psychiatric History:  Outpatient Treatment Psychiatric Medication:  Patient has been on [facility] for last 2 years, managed by [employer]. Best outcomes was with Prolixin per sister  Sister reports patient was DX with Paranoid Schizophrenia around the age of [age].   Current Mental Health Hospitalizations/Previous Mental Health History:  No current hospitalization per sister. Active with [facility]: medication Management   Current Provider:  Jodene Nam and Date:  April 2017, nothing current as patient has refused.  Current Medications:  AMB:  Tylenol, Lasix, Lisinopril (although refusing for over a week), Invega IM (not since April 2017)   Previous Inpatient Admission/Date/Reason:  None reported   Emotional Health/Current Symptoms  Suicide/Self Harm: None Reported Suicide Attempt in Past (date/description):  none  Other Harmful Behavior (ex. homicidal ideation) (describe):  None reported   Psychotic/Dissociative Symptoms  Psychotic/Dissociative Symptoms: Auditory Hallucinations,  Inability to Care for Self, Visual Hallucinations, Paranoia Other Psychotic/Dissociative Symptoms:  Patient has not been eating or sleeping, barricading self in home and windows covered. Sister came to check on patient and patient picked up an 8 inch knife as if he did not know her even when she called his name and told her who she was.     Attention/Behavioral Symptoms  Attention/Behavioral Symptoms: Unable to Accurately Assess (patient has been sleeping soundly all day, per notes, patient has bene calm and cooperattive.  No behaviors while on unit.  At home, patient has been impulsive, not sleeping and not his usual self) Other Attention/Behavioral Symptoms:  None currently.   Cognitive Impairment  Cognitive Impairment:  Within Normal Limits Other Cognitive Impairment:  Alert and oriented, when awake and through nursing notes   Mood and Adjustment  Mood and Adjustment:  Labile, Manic   Stress, Anxiety, Trauma, Any Recent Loss/Stressor  Stress, Anxiety, Trauma, Any Recent Loss/Stressor: None Reported Anxiety (frequency):  none  Phobia (specify):  none  Compulsive Behavior (specify):  none  Obsessive Behavior (specify):  none  Other Stress, Anxiety, Trauma, Any Recent Loss/Stressor:  No recent stressors other than stopping medication. No changes in routine or with family per sister   Substance Abuse/Use  Substance Abuse/Use: Current Substance Use (alcohol and tobacco use) SBIRT Completed (please refer for detailed history): No Self-reported Substance Use (last use and frequency):  Daily use of etoh  CIWA is 1  Urinary Drug Screen Completed: Yes (none) Alcohol Level:  Less than 5   Environment/Housing/Living Arrangement  Environmental/Housing/Living Arrangement: Stable Housing (patient lives alone) Who is in the Home:  Patient lives alone, sister comes routinely and sees patient, main contact for patient  Emergency Contact:  Art therapist, sister   Pharmacist, community:  Medicaid, Social Security Disability Income   Patient's Strengths and Goals  Patient's Strengths and Goals (patient's own words):  Did not assess   Clinical Social Worker's Interpretive Summary  Clinical Social Workers Interpretive Summary:  Patient is a 60 year old male admitted under IVC by sister due to stopping medications, AVH, paranoia, and barracading self in home. Sister concerned with patient and others well-being. Patient has been on Invega for past 2 years with poor outcomes per sister. Pt and sister report it is not working and they want to be started back on Prolixin.  Reports [facility] Sessions is provider for IM shots at this time. Discussed with sister possible need for admission to [facility] for stablization on medications.  Sister in agreement. Patient is his own legal guardian with sister assisting in decision making. No other community services in place.  Does not qualify for ACT, but may be a good referral for [facility]: [facility] if open. Awaiting MD (Psych) to see patient and medical stability prior to DC planning.   Disposition  Disposition: Inpatient Referral Made ([facility] Continuing To Support While In [facility] (Awaiting psych recommendations and consult.)   Lane Hacker, MSW Clinical Social Work: [employer] [phone]

## 2015-08-28 NOTE — Consult Note (Signed)
WOC wound consult note Reason for Consult:Venous insufficiency with partial thickness wound on anterior foot at bend in ankle. Patient examined with safety sitter in room. Wound type:Venous insufficiency Pressure Ulcer POA: No Measurement:0.5cm x 2.4cm x 0.2cm Wound bed:Dry, red with dried serum at periphery. Drainage (amount, consistency, odor) None Periwound:Erythematous, edematous, although staff report that this is improved since admission with patient lying in bed with LEs elevated Dressing procedure/placement/frequency: I will provide a moisture retentive environment for the repair and regeneration of the injured tissue on the RLE as well as gentle compression via ACE wraps (<95mmHg) at this time.  These will be changed and reapplied twice daily to ensure we do not sustain a medical device related skin injury (MDRPI). I have discussed this POC with the patient and he is in agreement.[place] nursing team will not follow, but will remain available to this patient, the nursing and medical teams.  Please re-consult if needed. Thanks, Maudie Flakes, MSN, RN, [place], Arther Abbott  Pager# [phone]

## 2015-08-29 DIAGNOSIS — Z8659 Personal history of other mental and behavioral disorders: Secondary | ICD-10-CM

## 2015-08-29 DIAGNOSIS — F17200 Nicotine dependence, unspecified, uncomplicated: Secondary | ICD-10-CM

## 2015-08-29 LAB — BASIC METABOLIC PANEL WITH GFR
Anion gap: 9 (ref 5–15)
BUN: 8 mg/dL (ref 6–20)
CO2: 37 mmol/L — ABNORMAL HIGH (ref 22–32)
Calcium: 8.7 mg/dL — ABNORMAL LOW (ref 8.9–10.3)
Chloride: 91 mmol/L — ABNORMAL LOW (ref 101–111)
Creatinine, Ser: 0.57 mg/dL — ABNORMAL LOW (ref 0.61–1.24)
GFR calc Af Amer: >60 mL/min
GFR calc non Af Amer: >60 mL/min
Glucose, Bld: 96 mg/dL (ref 65–99)
Potassium: 3.1 mmol/L — ABNORMAL LOW (ref 3.5–5.1)
Sodium: 137 mmol/L (ref 135–145)

## 2015-08-29 MED ORDER — POTASSIUM CHLORIDE CRYS ER 20 MEQ PO TBCR
40.0000 meq | EXTENDED_RELEASE_TABLET | Freq: Two times a day (BID) | ORAL | Status: AC
  Filled 2015-08-29 (×3): qty 2

## 2015-08-29 MED ORDER — IPRATROPIUM-ALBUTEROL 0.5-2.5 (3) MG/3ML IN SOLN: 3.0000 mL | Freq: Four times a day (QID) | RESPIRATORY_TRACT | Status: DC | PRN

## 2015-08-29 MED ORDER — POTASSIUM CHLORIDE 10 MEQ/100ML IV SOLN
10.0000 meq | INTRAVENOUS | Status: AC
  Administered 2015-08-29 (×5): 10 meq via INTRAVENOUS
  Filled 2015-08-29 (×5): qty 100

## 2015-08-29 NOTE — Progress Notes (Signed)
Pt has sitter at bedside, was admitted under IVC. CSW following.

## 2015-08-29 NOTE — Plan of Care (Signed)
Problem: Safety: Goal: Ability to remain free from injury will improve Continue.  Needs sitter for safety issues.    Problem: Pain Managment: Goal: General experience of comfort will improve Outcome: Completed/Met Date Met:  08/29/15 Denies pain.    Problem: Nutrition: Goal: Adequate nutrition will be maintained Outcome: Adequate for Discharge .  Problem: Activity: Goal: Capacity to carry out activities will improve Continue.  Mostly sleeping today.    Problem: Health Behavior/Discharge Planning: Goal: Ability to manage health-related needs will improve for discharge Outcome: Not Progressing Will continue to repeatedly educate pt on diagnosis and treatments.

## 2015-08-29 NOTE — Progress Notes (Signed)
Pt refused ALL medications this morning, including Potassium.  K+ is 3.1 and on lasix drip, educated pt on importance of compliance and he continued to refuse.  Dr. Aileen Fass Notified via text message.

## 2015-08-29 NOTE — Progress Notes (Signed)
[facility] PROGRESS NOTE    Progress Note  Robert Griffin  [id number] DOB: 1955-11-08 DOA: 08/27/2015 PCP: Robert Bush, MD     Brief Narrative:   Robert Griffin is an 60 y.o. male   Assessment/Plan:   Acute respiratory failure with hypoxia (HCC) due to Acute diastolic (congestive) heart failure (HCC) Continue IV Lasix to an infusion. Estimated dry weight was 112 kg, which is improving nicely today 118 kg Continue standing daily weights and fluid restriction. Monitor electrolytes and replete as needed. His abdomen is massively distended, abdominal ultrasound showed ascites. Diuresis well with IV Lasix, his blood pressure is been stable, he has had mild drop in his electrolytes and replete.  TOBACCO ABUSE/alcohol use: Continue to monitor with CIWA  SCHIZOPHRENIA, HX OF/Involuntary commitment secondary to schizophrenia and noncompliance with his medication: Appreciate psychiatry's assistance and recommended inpatient psychiatric admission when medically stable, Haldol when necessary for agitation.    Polycythemia: Is been worked in the past, present since 2009.  Essential hypertension: Continue lisinopril.  Stage II right foot ulcer: Consult wound care.  DVT prophylaxis: lovenox Family Communication:none Disposition Plan/Barrier to D/C: Will need to go to behavioral health. Code Status:     Code Status Orders        Start     Ordered   08/28/15 0411  Full code   Continuous     08/28/15 0413    Code Status History    Date Active Date Inactive Code Status Order ID Comments User Context   This patient has a current code status but no historical code status.        IV Access:    Peripheral IV   Procedures and diagnostic studies:   Dg Chest 2 View  08/27/2015  CLINICAL DATA:  Acute onset of hypoxia and cough. Initial encounter. EXAM: CHEST  2 VIEW COMPARISON:  Chest radiograph from 05/30/2015 FINDINGS: The lungs are well-aerated. Vascular  congestion is noted. Increased interstitial markings raise concern for mild pulmonary edema. Small bilateral pleural effusions are seen. No pneumothorax is seen. The cardiomediastinal silhouette is mildly enlarged. No acute osseous abnormalities are identified. IMPRESSION: Vascular congestion and mild cardiomegaly. Increased interstitial markings raise concern for mild pulmonary edema. Small bilateral pleural effusions seen. Electronically Signed   By: Garald Balding M.D.   On: 08/27/2015 23:39   Ct Angio Chest Pe W/cm &/or Wo Cm  08/28/2015  CLINICAL DATA:  60 year old male with hypoxia and shortness of breath EXAM: CT ANGIOGRAPHY CHEST WITH CONTRAST TECHNIQUE: Multidetector CT imaging of the chest was performed using the standard protocol during bolus administration of intravenous contrast. Multiplanar CT image reconstructions and MIPs were obtained to evaluate the vascular anatomy. CONTRAST:  100 cc Isovue 370 COMPARISON:  Chest radiograph dated 08/27/2015 FINDINGS: Evaluation of this exam is limited due to respiratory motion artifact. There is emphysematous changes of the lungs with chronic interstitial coarsening. There is no focal consolidation, pleural effusion, or pneumothorax. The central airways are patent. The thoracic aorta appears unremarkable. The origins of the great vessels of the aortic arch appear patent. No CT evidence of pulmonary embolism. Top-normal cardiac size. No pericardial effusion. There is coronary vascular calcification. Bilateral hilar adenopathy of indeterminate clinical significance. Although these may be reactive or related to underlying granulomatous disease, the possibility of metastatic disease is not excluded. Clinical correlation is recommended. The esophagus is grossly unremarkable. No thyroid nodules identified. There is no axillary adenopathy. There is diffuse subcutaneous stranding and edema of the lower chest  wall. There is mild degenerative changes of the spine. No  acute fracture. Partially visualized small amount of ascites in the upper abdomen. An ill-defined linear 5.3 cm hypodensity in the right lobe of the liver is not well characterized but may represent vascular structure. Review of the MIP images confirms the above findings. IMPRESSION: No CT evidence of pulmonary embolism. Emphysema with chronic interstitial scarring. Bilateral hilar adenopathy of indeterminate clinical significance. Clinical correlation is recommended new Partially visualized ascites. Electronically Signed   By: Anner Crete M.D.   On: 08/28/2015 02:51   US Abdomen Complete  08/28/2015  CLINICAL DATA:  Abdominal distension. EXAM: ABDOMEN ULTRASOUND COMPLETE COMPARISON:  None. FINDINGS: Gallbladder: No renal stones, sludge, Murphy's sign, or pericholecystic fluid. There is gallbladder wall thickening measuring 3.8 mm. The gallbladder is poorly distended. Common bile duct: Diameter: 3.6 mm. Liver: There is a small amount of ascites adjacent to the liver. No focal masses are seen within the liver. The sonographer described a mildly nodular contour to the liver which is not well appreciated on provided images or on the CT scan from earlier today. IVC: No abnormality visualized. Pancreas: Visualized portion unremarkable. Spleen: Small hyperechoic foci in the spleen may represent small granulomas, not seen on the visualized portions of the spleen from earlier today. Right Kidney: Length: 13.4 cm. Echogenicity within normal limits. No mass or hydronephrosis visualized. Left Kidney: Length: 13.2 cm. Echogenicity within normal limits. No mass or hydronephrosis visualized. Abdominal aorta: No aneurysm visualized. Other findings: None. IMPRESSION: 1. Gallbladder wall thickening and poor distention of the gallbladder with no stones, sludge, Murphy's sign, or pericholecystic fluid. 2. Mild ascites adjacent to the liver. 3. No other significant abnormalities. Electronically Signed   By: Dorise Bullion III  M.D   On: 08/28/2015 16:16     Medical Consultants:    None.  Anti-Infectives:   none  Subjective:    Robert Griffin poor insight on medical condition. He does not know when he stopped taking his medications  Objective:    Filed Vitals:   08/28/15 2115 08/28/15 2350 08/29/15 0535 08/29/15 0602  BP: 131/84 112/71 132/79   Pulse: 88 93 71   Temp: 97.9 F (36.6 C) 98.5 F (36.9 C) 97.4 F (36.3 C)   TempSrc: Oral Oral Oral   Resp: 20 24 20    Height:      Weight:    118.207 kg (260 lb 9.6 oz)  SpO2: 91% 91% 93%     Intake/Output Summary (Last 24 hours) at 08/29/15 1001 Last data filed at 08/29/15 0732  Gross per 24 hour  Intake 1303.86 ml  Output   3600 ml  Net -2296.14 ml   Filed Weights   08/28/15 0415 08/29/15 0602  Weight: 129.275 kg (285 lb) 118.207 kg (260 lb 9.6 oz)    Exam: General exam: In no acute distress.Morbidly obese Respiratory system: Good air movement and clear to auscultation. Cardiovascular system: S1 & S2 heard, RRR.  Gastrointestinal system: Massively distended abdomen with protrusion of the unable soft but nontender Central nervous system: Alert and oriented. No focal neurological deficits. Extremities: 3+ woody edema Skin: No rashes, lesions or ulcers Psychiatry: Poor insight of medical condition.   Data Reviewed:    Labs: Basic Metabolic Panel:  Recent Labs Lab 08/27/15 2255 08/29/15 0434  NA 135 137  K 4.2 3.1*  CL 101 91*  CO2 27 37*  GLUCOSE 89 96  BUN 7 8  CREATININE 0.72 0.57*  CALCIUM 8.6* 8.7*  GFR Estimated Creatinine Clearance: 129.7 mL/min (by C-G formula based on Cr of 0.57). Liver Function Tests:  Recent Labs Lab 08/27/15 2255  AST 28  ALT 18  ALKPHOS 55  BILITOT 0.9  PROT 6.7  ALBUMIN 3.1*   No results for input(s): LIPASE, AMYLASE in the last 168 hours. No results for input(s): AMMONIA in the last 168 hours. Coagulation profile  Recent Labs Lab 08/27/15 2312  INR 1.26     CBC:  Recent Labs Lab 08/27/15 2255 08/28/15 0450  WBC 8.4 6.7  NEUTROABS 5.6 4.6  HGB 17.2* 18.3*  HCT 52.3* 55.6*  MCV 92.6 94.4  PLT 160 148*   Cardiac Enzymes:  Recent Labs Lab 08/28/15 0450  TROPONINI 0.13*   BNP (last 3 results) No results for input(s): PROBNP in the last 8760 hours. CBG: No results for input(s): GLUCAP in the last 168 hours. D-Dimer: No results for input(s): DDIMER in the last 72 hours. Hgb A1c: No results for input(s): HGBA1C in the last 72 hours. Lipid Profile: No results for input(s): CHOL, HDL, LDLCALC, TRIG, CHOLHDL, LDLDIRECT in the last 72 hours. Thyroid function studies:  Recent Labs  08/28/15 0450  TSH 2.534   Anemia work up: No results for input(s): VITAMINB12, FOLATE, FERRITIN, TIBC, IRON, RETICCTPCT in the last 72 hours. Sepsis Labs:  Recent Labs Lab 08/27/15 2255 08/28/15 0450  WBC 8.4 6.7   Microbiology No results found for this or any previous visit (from the past 240 hour(s)).   Medications:   . benztropine  0.5 mg Oral BID  . enoxaparin (LOVENOX) injection  60 mg Subcutaneous Q24H  . fluPHENAZine  5 mg Oral BID  . folic acid  1 mg Oral Daily  . ipratropium-albuterol  3 mL Nebulization BID  . lisinopril  30 mg Oral Daily  . multivitamin with minerals  1 tablet Oral Daily  . sodium chloride flush  3 mL Intravenous Q12H  . thiamine  100 mg Oral Daily   Or  . thiamine  100 mg Intravenous Daily  . Vitamin D (Ergocalciferol)  50,000 Units Oral Weekly   Continuous Infusions: . furosemide (LASIX) infusion 8 mg/hr (08/28/15 1008)    Time spent: 25 min   LOS: 1 day   Charlynne Cousins  [facility] Pager [phone]  *Please refer to [email or web address], password [email or web address] to get updated schedule on who will round on this patient, as hospitalists switch teams weekly. If 7PM-7AM, please contact night-coverage at [email or web address], password [email or web address] for any overnight needs.  08/29/2015, 10:01 AM

## 2015-08-29 NOTE — H&P (Signed)
Patient has consistently refused nebulizers since his arrival to the hospital this admission. Only once, has he been compliant over the past approximate 48 hours. Order changed to as needed per RT protocol.

## 2015-08-30 DIAGNOSIS — I5031 Acute diastolic (congestive) heart failure: Secondary | ICD-10-CM

## 2015-08-30 DIAGNOSIS — Z046 Encounter for general psychiatric examination, requested by authority: Secondary | ICD-10-CM

## 2015-08-30 DIAGNOSIS — J9601 Acute respiratory failure with hypoxia: Secondary | ICD-10-CM

## 2015-08-30 LAB — BASIC METABOLIC PANEL
ANION GAP: 11 (ref 5–15)
BUN: 14 mg/dL (ref 6–20)
CALCIUM: 9 mg/dL (ref 8.9–10.3)
CO2: 39 mmol/L — ABNORMAL HIGH (ref 22–32)
CREATININE: 0.74 mg/dL (ref 0.61–1.24)
Chloride: 85 mmol/L — ABNORMAL LOW (ref 101–111)
GFR calc Af Amer: >60 mL/min (ref >60)
GLUCOSE: 89 mg/dL (ref 65–99)
Potassium: 3.1 mmol/L — ABNORMAL LOW (ref 3.5–5.1)
Sodium: 135 mmol/L (ref 135–145)

## 2015-08-30 MED ORDER — FUROSEMIDE 40 MG PO TABS
40.0000 mg | ORAL_TABLET | Freq: Two times a day (BID) | ORAL | Status: DC
  Administered 2015-08-30 – 2015-09-01 (×4): 40 mg via ORAL
  Filled 2015-08-30 (×4): qty 1

## 2015-08-30 MED ORDER — FLUPHENAZINE HCL 5 MG PO TABS: 5.0000 mg | ORAL_TABLET | Freq: Two times a day (BID) | ORAL | Status: DC

## 2015-08-30 MED ORDER — METOLAZONE 2.5 MG PO TABS
2.5000 mg | ORAL_TABLET | Freq: Once | ORAL | Status: AC
  Administered 2015-08-30: 2.5 mg via ORAL
  Filled 2015-08-30: qty 1

## 2015-08-30 MED ORDER — POTASSIUM CHLORIDE CRYS ER 20 MEQ PO TBCR
40.0000 meq | EXTENDED_RELEASE_TABLET | Freq: Two times a day (BID) | ORAL | Status: DC
  Administered 2015-08-30 – 2015-09-01 (×5): 40 meq via ORAL
  Filled 2015-08-30 (×4): qty 2

## 2015-08-30 MED ORDER — BENZTROPINE MESYLATE 0.5 MG PO TABS
0.5000 mg | ORAL_TABLET | Freq: Two times a day (BID) | ORAL | Status: DC
Start: 2015-08-30 — End: 2015-09-01

## 2015-08-30 NOTE — Discharge Summary (Signed)
Physician Discharge Summary  Robert Griffin [id number] DOB: August 31, 1955 DOA: 08/27/2015  PCP: Ria Bush, MD  Admit date: 08/27/2015 Discharge date: 08/30/2015  Time spent:35 minutes  Recommendations for Outpatient Follow-up:  1. Patient will return to behavioral health. Check a basic metabolic panel in 2-3 days. 2. Please follow-up with cardiology in 1-2 weeks after discharge from behavioral health.   Discharge Diagnoses:  Principal Problem:   Acute diastolic (congestive) heart failure (HCC) Active Problems:   TOBACCO ABUSE   SCHIZOPHRENIA, HX OF   Heart failure (HCC)   Involuntary commitment   Acute respiratory failure with hypoxia [facility])   Discharge Condition: stable  Diet recommendation: low sodium fluid restricted diet  Filed Weights   08/28/15 0415 08/29/15 0602 08/30/15 0500  Weight: 129.275 kg (285 lb) 118.207 kg (260 lb 9.6 oz) 112.583 kg (248 lb 3.2 oz)    History of present illness:  60 year old with past medical history of schizophrenia tobacco, chronic diastolic heart failure who presents after he had been involuntarily committed by family members for barricading himself at home and on taking any of his medication. Haziness not being significant short of breath with ambulation, likely has noted swelling of the legs and of the abdomen. Saturations in the emergency room showed his O2 sat at 70% on room air.   Hospital Course:  Acute respiratory failure with hypoxia due acute diastolic heart failure and right-sided heart failure: He was started on aggressive IV diuretics with good diuresis, his weight came down to less than 112 kg by the time he was discharged. He was placed back on his home dose regimen of oral Lasix. Electrolytes were repleted as needed. An abdominal ultrasound shows ascites. His blood pressure remains stable. He will be transferred to behavioral health will keep him fluid restricted and monitor his electrolytes in one  week.  Schizophrenia/involuntarily committed secondary to medication noncompliance: Psychiatry was consulted who recommended inpatient psychiatric admission when medically stable. His home medications were resumed.  Polycythemia: It has been worked up in the past.  Essential hypertension: Resume home regimen.  Stage II right foot ulcer: Wound care was consulted, recommended gentle compression Ace wraps.  Procedures:  Chest x-ray  Abdominal ultrasound  Consultations:  none  Discharge Exam: Filed Vitals:   08/30/15 0458 08/30/15 0636  BP: 125/86   Pulse: 74   Temp:  97.9 F (36.6 C)  Resp: 20     General: A&O x3 Cardiovascular: RRR Respiratory: good air movement CTA B/L  Discharge Instructions   Discharge Instructions    Diet - low sodium heart healthy    Complete by:  As directed      Increase activity slowly    Complete by:  As directed           Current Discharge Medication List    START taking these medications   Details  benztropine (COGENTIN) 0.5 MG tablet Take 1 tablet (0.5 mg total) by mouth 2 (two) times daily.    fluPHENAZine (PROLIXIN) 5 MG tablet Take 1 tablet (5 mg total) by mouth 2 (two) times daily.      CONTINUE these medications which have NOT CHANGED   Details  acetaminophen (TYLENOL) 500 MG tablet Take 500 mg by mouth every 6 (six) hours as needed (for pain.).    furosemide (LASIX) 40 MG tablet Take 40 mg by mouth 2 (two) times daily.  Refills: 1   Associated Diagnoses: Chest pain, unspecified chest pain type    lisinopril (PRINIVIL,ZESTRIL) 30 MG tablet Take  30 mg by mouth daily. Refills: 0   Associated Diagnoses: Chest pain, unspecified chest pain type    Paliperidone Palmitate (INVEGA SUSTENNA) 117 MG/0.75ML SUSP Inject 117 mg into the muscle. Every 28 days    Vitamin D, Ergocalciferol, (DRISDOL) 50000 units CAPS capsule Take 50,000 Units by mouth once a week. Refills: 20      STOP taking these medications     ibuprofen  (ADVIL,MOTRIN) 200 MG tablet        Allergies  Allergen Reactions  . Asa [Aspirin] Other (See Comments)    As a child unsure of reaction  . Penicillins Other (See Comments)    Pt cannot remember type of reaction Has patient had a PCN reaction causing immediate rash, facial/tongue/throat swelling, SOB or lightheadedness with hypotension:unsure Has patient had a PCN reaction causing severe rash involving mucus membranes or skin necrosis:unsure Has patient had a PCN reaction that required hospitalization:unsure Has patient had a PCN reaction occurring within the last 10 years:does not think so If all of the above answers are "NO", then may proceed with Cephalosporin use.       The results of significant diagnostics from this hospitalization (including imaging, microbiology, ancillary and laboratory) are listed below for reference.    Significant Diagnostic Studies: Dg Chest 2 View  08/27/2015  CLINICAL DATA:  Acute onset of hypoxia and cough. Initial encounter. EXAM: CHEST  2 VIEW COMPARISON:  Chest radiograph from 05/30/2015 FINDINGS: The lungs are well-aerated. Vascular congestion is noted. Increased interstitial markings raise concern for mild pulmonary edema. Small bilateral pleural effusions are seen. No pneumothorax is seen. The cardiomediastinal silhouette is mildly enlarged. No acute osseous abnormalities are identified. IMPRESSION: Vascular congestion and mild cardiomegaly. Increased interstitial markings raise concern for mild pulmonary edema. Small bilateral pleural effusions seen. Electronically Signed   By: Garald Balding M.D.   On: 08/27/2015 23:39   Ct Angio Chest Pe W/cm &/or Wo Cm  08/28/2015  CLINICAL DATA:  60 year old male with hypoxia and shortness of breath EXAM: CT ANGIOGRAPHY CHEST WITH CONTRAST TECHNIQUE: Multidetector CT imaging of the chest was performed using the standard protocol during bolus administration of intravenous contrast. Multiplanar CT image  reconstructions and MIPs were obtained to evaluate the vascular anatomy. CONTRAST:  100 cc Isovue 370 COMPARISON:  Chest radiograph dated 08/27/2015 FINDINGS: Evaluation of this exam is limited due to respiratory motion artifact. There is emphysematous changes of the lungs with chronic interstitial coarsening. There is no focal consolidation, pleural effusion, or pneumothorax. The central airways are patent. The thoracic aorta appears unremarkable. The origins of the great vessels of the aortic arch appear patent. No CT evidence of pulmonary embolism. Top-normal cardiac size. No pericardial effusion. There is coronary vascular calcification. Bilateral hilar adenopathy of indeterminate clinical significance. Although these may be reactive or related to underlying granulomatous disease, the possibility of metastatic disease is not excluded. Clinical correlation is recommended. The esophagus is grossly unremarkable. No thyroid nodules identified. There is no axillary adenopathy. There is diffuse subcutaneous stranding and edema of the lower chest wall. There is mild degenerative changes of the spine. No acute fracture. Partially visualized small amount of ascites in the upper abdomen. An ill-defined linear 5.3 cm hypodensity in the right lobe of the liver is not well characterized but may represent vascular structure. Review of the MIP images confirms the above findings. IMPRESSION: No CT evidence of pulmonary embolism. Emphysema with chronic interstitial scarring. Bilateral hilar adenopathy of indeterminate clinical significance. Clinical correlation is recommended new Partially  visualized ascites. Electronically Signed   By: Anner Crete M.D.   On: 08/28/2015 02:51   US Abdomen Complete  08/28/2015  CLINICAL DATA:  Abdominal distension. EXAM: ABDOMEN ULTRASOUND COMPLETE COMPARISON:  None. FINDINGS: Gallbladder: No renal stones, sludge, Murphy's sign, or pericholecystic fluid. There is gallbladder wall  thickening measuring 3.8 mm. The gallbladder is poorly distended. Common bile duct: Diameter: 3.6 mm. Liver: There is a small amount of ascites adjacent to the liver. No focal masses are seen within the liver. The sonographer described a mildly nodular contour to the liver which is not well appreciated on provided images or on the CT scan from earlier today. IVC: No abnormality visualized. Pancreas: Visualized portion unremarkable. Spleen: Small hyperechoic foci in the spleen may represent small granulomas, not seen on the visualized portions of the spleen from earlier today. Right Kidney: Length: 13.4 cm. Echogenicity within normal limits. No mass or hydronephrosis visualized. Left Kidney: Length: 13.2 cm. Echogenicity within normal limits. No mass or hydronephrosis visualized. Abdominal aorta: No aneurysm visualized. Other findings: None. IMPRESSION: 1. Gallbladder wall thickening and poor distention of the gallbladder with no stones, sludge, Murphy's sign, or pericholecystic fluid. 2. Mild ascites adjacent to the liver. 3. No other significant abnormalities. Electronically Signed   By: Dorise Bullion III M.D   On: 08/28/2015 16:16    Microbiology: No results found for this or any previous visit (from the past 240 hour(s)).   Labs: Basic Metabolic Panel:  Recent Labs Lab 08/27/15 2255 08/29/15 0434 08/30/15 0455  NA 135 137 135  K 4.2 3.1* 3.1*  CL 101 91* 85*  CO2 27 37* 39*  GLUCOSE 89 96 89  BUN 7 8 14   CREATININE 0.72 0.57* 0.74  CALCIUM 8.6* 8.7* 9.0   Liver Function Tests:  Recent Labs Lab 08/27/15 2255  AST 28  ALT 18  ALKPHOS 55  BILITOT 0.9  PROT 6.7  ALBUMIN 3.1*   No results for input(s): LIPASE, AMYLASE in the last 168 hours. No results for input(s): AMMONIA in the last 168 hours. CBC:  Recent Labs Lab 08/27/15 2255 08/28/15 0450  WBC 8.4 6.7  NEUTROABS 5.6 4.6  HGB 17.2* 18.3*  HCT 52.3* 55.6*  MCV 92.6 94.4  PLT 160 148*   Cardiac Enzymes:  Recent  Labs Lab 08/28/15 0450  TROPONINI 0.13*   BNP: BNP (last 3 results)  Recent Labs  08/27/15 2255  BNP 432.9*    ProBNP (last 3 results) No results for input(s): PROBNP in the last 8760 hours.  CBG: No results for input(s): GLUCAP in the last 168 hours.     Signed:  Charlynne Cousins MD.  [facility] 08/30/2015, 10:48 AM

## 2015-08-30 NOTE — Progress Notes (Signed)
LCSWA following for disposition to [facility], patient referrals sent to:  [id number] [facility]: Pending [facility]: Pending  Kathrin Greathouse, Latanya Presser, MSW Clinical Social Worker 5E and Psychiatric Service Line [phone] 08/30/2015  11:54 AM

## 2015-08-30 NOTE — Progress Notes (Signed)
Spoke with AC-Tina at [facility]. Patient may have bed available tomorrow.  Kathrin Greathouse, Latanya Presser, MSW Clinical Social Worker 5E and Psychiatric Service Line [phone] 08/30/2015  4:43 PM

## 2015-08-30 NOTE — Progress Notes (Signed)
PT Cancellation Note  Patient Details Name: Robert Griffin MRN: [id number] DOB: [birth year]   Cancelled Treatment:    Reason Eval/Treat Not Completed: Other (comment) (eating breakfast. will check back later today. )   Claretha Cooper 08/30/2015, 8:32 AM Tresa Endo PT [phone]

## 2015-08-30 NOTE — Clinical Social Work Psych Note (Addendum)
Clinical Social Worker Psych Service Line Progress Note  Clinical Social Worker: Lia Hopping, LCSW Date/Time: 08/30/2015, 3:03 PM   Review of Patient  Overall Medical Condition:  Patient has been medically discharged.   Participation Level:  Minimal Participation Quality: Resistant Other Participation Quality:  Patient was resistant to talk with [facility], but gave LCSWA permission to talk with his sister Gwinda Passe.   Affect: Flat Cognitive: Alert Reaction to Medications/Concerns: Patient did not have any concerns with medication.   Modes of Intervention: Exploration, Education, Support   Summary of Progress/Plan at Discharge  Summary of Progress/Plan at Discharge: Patient reports he is feeling better and does not understand his reason for [facility] disposition. LCSWA informed patient about IVC by sister due to stopping medications, AVH, paranoia, and barracading self in home. LCSWA explained patient sister concerns for him. Patient reports he did not have any questions or concerns regarding [facility] placement.   Plan for Discharge: [facility]. Waiting for Bed at this time. Pending Request.

## 2015-08-30 NOTE — Plan of Care (Signed)
Problem: Safety: Goal: Ability to remain free from injury will improve Outcome: Progressing Cooperative except for taking medications.  Sitter remains at bedside.  Awaiting bed availability with behavior health.    Problem: Health Behavior/Discharge Planning: Goal: Ability to manage health-related needs will improve Continue.    Problem: Skin Integrity: Goal: Risk for impaired skin integrity will decrease Continue.    Problem: Tissue Perfusion: Goal: Risk factors for ineffective tissue perfusion will decrease Outcome: Progressing  Continue to monitor.    Problem: Fluid Volume: Goal: Ability to maintain a balanced intake and output will improve Outcome: Progressing Lasix drip discontinued by MD.  Fluid restriction of 188ml remains.  Will continue with strict I/O's.    Problem: Education: Goal: Ability to demonstrate managment of disease process will improve Continue.

## 2015-08-31 DIAGNOSIS — D696 Thrombocytopenia, unspecified: Secondary | ICD-10-CM

## 2015-08-31 DIAGNOSIS — D751 Secondary polycythemia: Secondary | ICD-10-CM

## 2015-08-31 DIAGNOSIS — J9692 Respiratory failure, unspecified with hypercapnia: Secondary | ICD-10-CM

## 2015-08-31 DIAGNOSIS — J9691 Respiratory failure, unspecified with hypoxia: Secondary | ICD-10-CM

## 2015-08-31 LAB — BASIC METABOLIC PANEL
Anion gap: 9 (ref 5–15)
BUN: 15 mg/dL (ref 6–20)
CHLORIDE: 89 mmol/L — AB (ref 101–111)
CO2: 36 mmol/L — AB (ref 22–32)
Calcium: 8.8 mg/dL — ABNORMAL LOW (ref 8.9–10.3)
Creatinine, Ser: 0.71 mg/dL (ref 0.61–1.24)
GFR calc Af Amer: >60 mL/min (ref >60)
GFR calc non Af Amer: >60 mL/min (ref >60)
Glucose, Bld: 90 mg/dL (ref 65–99)
POTASSIUM: 3.6 mmol/L (ref 3.5–5.1)
SODIUM: 134 mmol/L — AB (ref 135–145)

## 2015-08-31 NOTE — Progress Notes (Signed)
PROGRESS NOTE    Robert Griffin  [id number] DOB: 1956/02/04 DOA: 08/27/2015  PCP: Ria Bush, MD   Brief Narrative:  60 year old schizophrenic, smoker with recently diagnosed right heart failure suspected to be secondary to pulmonary hypertension, possible lower extremity venous stasis who was brought to the hospital under involuntary commitment. Apparently his sister initiated this because he had "barricaded" himself and his house and had not been taking his medications.  Subjective: Poor historian. States there is nothing wrong with him and he does not need oxygen. He is coughing. When asked about his cough, he states is nonproductive. No dyspnea and no chest pain. He tells me his primary care physician is Dr. Rex Kras but he cannot remember the last time that he saw him and he does not want to see him anymore.  Assessment & Plan:   Principal Problem: Hypercarbic and hypoxic respiratory failure -Suspect this is chronic and has just been discovered- I suspect his polycythemia is secondary to chronic hypoxia -CT scan does not show any evidence of heart failure but does show pulmonary fibrosis  -Was noted to be hypoxic with a pulse ox in the 70s-there is no record of what his prior oxygen saturations have been-he was recently at the cardiology office however, there is no mention of a pulse ox being done at that time  -I suspect that he has chronic hypoxia from pulmonary fibrosis which may be related to smoking  -I suspect his heart failure is mostly right heart failure -last echo showed normal LV function and normal relaxation but did show elevated RV pressures adjusting pulmonary hypertension-the level of pulmonary hypertension was not able to be calculated-he needs a right heart cath, pulmonary function tests and a sleep study to assess for sleep apnea - He is currently on his home dose of furosemide at 40 mg twice a day-he received 1 dose of IV Lasix on 6/25 and it was then  subsequently changed to an infusion-weight has improved from 129 210 kg  Active Problems:    TOBACCO ABUSE -Advised to discontinue    SCHIZOPHRENIA, HX OF -He is not giving me an accurate history but prior notes mention that he has not received his Invega in quite a while -Psychiatry has started Prolixin and Cogentin in hopes that he may stabilize enough to go home    Involuntary commitment -No psychiatric facility has been found as of yet and will take him with his oxygen  Thrombocytopenia -Platelets 160 on admission, now 148-last platelets and 2010 were 261  Polycythemia -Suspect this is secondary to chronic hypoxia-hemoglobin was 18.4 in 2009  Hypokalemia -Replaced  Elevated troponin -Most likely secondary to severe hypoxia-obtain EKG-no complaints of chest pain today   DVT prophylaxis: Lovenox Code Status: Full code Family Communication:  Disposition Plan: Transfer to psychiatric facility Consultants:   psychiatry Procedures:   none Antimicrobials:  Anti-infectives    None       Objective: Filed Vitals:   08/30/15 2214 08/31/15 0500 08/31/15 0539 08/31/15 1145  BP: 108/80  110/58 95/60  Pulse: 85  78 84  Temp: 97.6 F (36.4 C)  98.3 F (36.8 C) 97.5 F (36.4 C)  TempSrc: Oral  Oral Oral  Resp: 19  18 18   Height:      Weight:  110.224 kg (243 lb)    SpO2: 96%  91% 91%    Intake/Output Summary (Last 24 hours) at 08/31/15 1735 Last data filed at 08/31/15 1500  Gross per 24 hour  Intake  1400 ml  Output      0 ml  Net   1400 ml   Filed Weights   08/29/15 0602 08/30/15 0500 08/31/15 0500  Weight: 118.207 kg (260 lb 9.6 oz) 112.583 kg (248 lb 3.2 oz) 110.224 kg (243 lb)    Examination: General exam: Appears comfortable  HEENT: PERRLA, oral mucosa moist, no sclera icterus or thrush Respiratory system: Clear to auscultation. Respiratory effort normal. Cardiovascular system: S1 & S2 heard, RRR.  No murmurs  Gastrointestinal system: Abdomen  soft, non-tender, nondistended. Normal bowel sound. No organomegaly Central nervous system: Alert and oriented. No focal neurological deficits. Extremities: No cyanosis, clubbing or edema Skin: No rashes or ulcers Psychiatry:  Mood & affect appropriate.     Data Reviewed: I have personally reviewed following labs and imaging studies  CBC:  Recent Labs Lab 08/27/15 2255 08/28/15 0450  WBC 8.4 6.7  NEUTROABS 5.6 4.6  HGB 17.2* 18.3*  HCT 52.3* 55.6*  MCV 92.6 94.4  PLT 160 123456*   Basic Metabolic Panel:  Recent Labs Lab 08/27/15 2255 08/29/15 0434 08/30/15 0455 08/31/15 0508  NA 135 137 135 134*  K 4.2 3.1* 3.1* 3.6  CL 101 91* 85* 89*  CO2 27 37* 39* 36*  GLUCOSE 89 96 89 90  BUN 7 8 14 15   CREATININE 0.72 0.57* 0.74 0.71  CALCIUM 8.6* 8.7* 9.0 8.8*   GFR: Estimated Creatinine Clearance: 125.1 mL/min (by C-G formula based on Cr of 0.71). Liver Function Tests:  Recent Labs Lab 08/27/15 2255  AST 28  ALT 18  ALKPHOS 55  BILITOT 0.9  PROT 6.7  ALBUMIN 3.1*   No results for input(s): LIPASE, AMYLASE in the last 168 hours. No results for input(s): AMMONIA in the last 168 hours. Coagulation Profile:  Recent Labs Lab 08/27/15 2312  INR 1.26   Cardiac Enzymes:  Recent Labs Lab 08/28/15 0450  TROPONINI 0.13*   BNP (last 3 results) No results for input(s): PROBNP in the last 8760 hours. HbA1C: No results for input(s): HGBA1C in the last 72 hours. CBG: No results for input(s): GLUCAP in the last 168 hours. Lipid Profile: No results for input(s): CHOL, HDL, LDLCALC, TRIG, CHOLHDL, LDLDIRECT in the last 72 hours. Thyroid Function Tests: No results for input(s): TSH, T4TOTAL, FREET4, T3FREE, THYROIDAB in the last 72 hours. Anemia Panel: No results for input(s): VITAMINB12, FOLATE, FERRITIN, TIBC, IRON, RETICCTPCT in the last 72 hours. Urine analysis:    Component Value Date/Time   COLORURINE YELLOW 08/28/2015 0350   APPEARANCEUR CLEAR 08/28/2015  0350   LABSPEC 1.016 08/28/2015 0350   PHURINE 6.0 08/28/2015 0350   GLUCOSEU NEGATIVE 08/28/2015 0350   HGBUR NEGATIVE 08/28/2015 0350   BILIRUBINUR NEGATIVE 08/28/2015 0350   KETONESUR NEGATIVE 08/28/2015 0350   PROTEINUR NEGATIVE 08/28/2015 0350   NITRITE NEGATIVE 08/28/2015 0350   LEUKOCYTESUR NEGATIVE 08/28/2015 0350   Sepsis Labs: @LABRCNTIP (procalcitonin:4,lacticidven:4) )No results found for this or any previous visit (from the past 240 hour(s)).       Radiology Studies: No results found.    Scheduled Meds: . benztropine  0.5 mg Oral BID  . enoxaparin (LOVENOX) injection  60 mg Subcutaneous Q24H  . fluPHENAZine  5 mg Oral BID  . folic acid  1 mg Oral Daily  . furosemide  40 mg Oral BID  . lisinopril  30 mg Oral Daily  . multivitamin with minerals  1 tablet Oral Daily  . potassium chloride  40 mEq Oral BID  . sodium chloride  flush  3 mL Intravenous Q12H  . thiamine  100 mg Oral Daily   Or  . thiamine  100 mg Intravenous Daily  . Vitamin D (Ergocalciferol)  50,000 Units Oral Weekly   Continuous Infusions:    LOS: 3 days    Time spent in minutes: 25    Pilgrim, MD [facility] Pager: [email or web address] Password [facility] 08/31/2015, 5:35 PM

## 2015-08-31 NOTE — Evaluation (Signed)
Physical Therapy Evaluation Patient Details Name: Robert Griffin MRN: [id number] DOB: 05-Apr-1955 Today's Date: 08/31/2015   History of Present Illness  60 year old with past medical history of schizophrenia tobacco, chronic diastolic heart failure who presents after he had been involuntarily committed by family members for barricading himself at home and on taking any of his medication.  Clinical Impression  The patient is ambulating well without assistance. Oxygen saturation  Dropped to 82 % after ambulating x 400'. Required 4 liters to get saturation back > 90%. RN aware. Pt admitted with above diagnosis. Pt currently with functional limitations due to the deficits listed below (see PT Problem List).  Pt will benefit from skilled PT to increase their independence and safety with mobility to allow discharge to the venue listed below.       Follow Up Recommendations No PT follow up    Equipment Recommendations  None recommended by PT    Recommendations for Other Services       Precautions / Restrictions Precautions Precaution Comments: monitor sats Restrictions Weight Bearing Restrictions: No      Mobility  Bed Mobility Overal bed mobility: Independent                Transfers Overall transfer level: Independent                  Ambulation/Gait Ambulation/Gait assistance: Supervision Ambulation Distance (Feet): 400 Feet Assistive device: None Gait Pattern/deviations: WFL(Within Functional Limits)     General Gait Details: sats on RA 82 % after ambulation.92 % on 4 l prior. Placed on 2 liters with sats only rising to 88%, then back to 4 liters with sats to 92%. RN notified.  Stairs            Wheelchair Mobility    Modified Rankin (Stroke Patients Only)       Balance Overall balance assessment: Needs assistance Sitting-balance support: No upper extremity supported;Feet supported Sitting balance-Leahy Scale: Normal     Standing balance  support: During functional activity;No upper extremity supported Standing balance-Leahy Scale: Good                               Pertinent Vitals/Pain Pain Assessment: No/denies pain    Home Living Family/patient expects to be discharged to:: Private residence Living Arrangements: Alone   Type of Home: House Home Access: Stairs to enter   [employer] of Steps: 3-4     Additional Comments: patient vague and  does not give a lot ogf info    Prior Function Level of Independence: Independent               Hand Dominance        Extremity/Trunk Assessment   Upper Extremity Assessment: Overall WFL for tasks assessed           Lower Extremity Assessment: Overall WFL for tasks assessed      Cervical / Trunk Assessment: Normal  Communication      Cognition Arousal/Alertness: Awake/alert Behavior During Therapy: Flat affect Overall Cognitive Status: Difficult to assess                 General Comments: oriented to  [facility] long    General Comments      Exercises        Assessment/Plan    PT Assessment Patient needs continued PT services  PT Diagnosis Generalized weakness   PT Problem List Decreased activity tolerance  PT Treatment Interventions Gait training;Functional mobility training;Patient/family education   PT Goals (Current goals can be found in the Care Plan section) Acute Rehab PT Goals Patient Stated Goal: agreed to walk PT Goal Formulation: With patient Time For Goal Achievement: 09/14/15 Potential to Achieve Goals: Good    Frequency Min 3X/week   Barriers to discharge        Co-evaluation               End of Session Equipment Utilized During Treatment: Gait belt Activity Tolerance: Patient tolerated treatment well Patient left: in bed;with call bell/phone within reach;with nursing/sitter in room Nurse Communication: Mobility status         Time: 1130-1155 PT Time Calculation (min)  (ACUTE ONLY): 25 min   Charges:   PT Evaluation $PT Eval Low Complexity: 1 Procedure PT Treatments $Gait Training: 8-22 mins   PT G Codes:        Claretha Cooper 08/31/2015, 12:03 PM Tresa Endo PT [phone]

## 2015-08-31 NOTE — Progress Notes (Addendum)
[facility] contacted [facility].    Patient is ambulatory but needs O2. Facilities are denying due to equipment and the hazard it can cause on unit. [facility] inquiring about possible Gero [facility].   [facility] :Denied.  [facility]: Pending [facility]: Pending  [facility]: Pending. [facility]: Full. No Beds.   Kathrin Greathouse, Latanya Presser, MSW Clinical Social Worker 5E and Psychiatric Service Line [phone] 08/31/2015  10:11 AM

## 2015-09-01 DIAGNOSIS — J9612 Chronic respiratory failure with hypercapnia: Secondary | ICD-10-CM

## 2015-09-01 DIAGNOSIS — Z9114 Patient's other noncompliance with medication regimen: Secondary | ICD-10-CM | POA: Insufficient documentation

## 2015-09-01 DIAGNOSIS — J9611 Chronic respiratory failure with hypoxia: Secondary | ICD-10-CM

## 2015-09-01 DIAGNOSIS — F209 Schizophrenia, unspecified: Secondary | ICD-10-CM | POA: Insufficient documentation

## 2015-09-01 DIAGNOSIS — I2609 Other pulmonary embolism with acute cor pulmonale: Secondary | ICD-10-CM

## 2015-09-01 MED ORDER — BENZTROPINE MESYLATE 0.5 MG PO TABS: 0.5000 mg | ORAL_TABLET | Freq: Two times a day (BID) | ORAL | Status: AC

## 2015-09-01 MED ORDER — FLUPHENAZINE HCL 5 MG PO TABS: 5.0000 mg | ORAL_TABLET | Freq: Two times a day (BID) | ORAL | Status: AC

## 2015-09-01 MED ORDER — ENOXAPARIN SODIUM 60 MG/0.6ML ~~LOC~~ SOLN: 0.5000 mg/kg | SUBCUTANEOUS | Status: DC

## 2015-09-01 NOTE — Discharge Summary (Signed)
Physician Discharge Summary  Robert Griffin [id number] DOB: 06-16-1955 DOA: 08/27/2015  PCP: Ria Bush, MD  Admit date: 08/27/2015 Discharge date: 09/01/2015  Admitted From: home  Disposition:  home   Recommendations for Outpatient Follow-up:  1. Follow up with PCP in 1-2 weeks 2. Follow up with pulmonary- needs PFTs and sleep study 3. Still needs right heart  Home Health:  RN  Equipment/Devices:  O2  Discharge Condition:  stable  CODE STATUS:  Full code   Diet recommendation:  Heart healthy Consultations:    Discharge Diagnoses:  Principal Problem:   Acute cor pulmonale (HCC) Active Problems:  Hypercarbic and hypoxic respiratory failure   TOBACCO ABUSE   SCHIZOPHRENIA, HX OF   Involuntary commitment   Schizophrenia ([place])   Noncompliance with medication regimen   Subjective: 60 year old schizophrenic, smoker with recently diagnosed right heart failure suspected to be secondary to pulmonary hypertension, possible lower extremity venous stasis who was brought to the hospital under involuntary commitment. Apparently his sister initiated this because he had "barricaded" himself and his house and had not been taking his medications. The patient was transferred to my service on 08/31/15.    Brief Summary: Principal Problem: Hypercarbic and hypoxic respiratory failure- cor pulmonale -Suspect this is chronic and has just been discovered- I suspect his polycythemia is secondary to chronic hypoxia- - he has clubbing of his fingernails -CT scan does not show any evidence of heart failure but does show pulmonary fibrosis  -Was noted to be hypoxic with a pulse ox in the 70s-there is no prior record of what his prior oxygen saturations have been -I suspect that he has chronic hypoxia from pulmonary fibrosis which may be related to smoking  -I suspect his heart failure is mostly right heart failure -last echo showed normal LV function and normal relaxation but did show  elevated RV pressures adjusting pulmonary hypertension-the level of pulmonary hypertension was not able to be calculated-he needs a right heart cath, pulmonary function tests and a sleep study to assess for sleep apnea - He is currently on his home dose of furosemide at 40 mg twice a day-he received 1 dose of IV Lasix on 6/25 and it was then subsequently changed to an infusion-weight has improved from 129 tp 108 kg - plan discussed in detail with patient - have also had a discussion with his sister about the plan  Active Problems:   TOBACCO ABUSE -strictly advised to discontinue especially advised not to smoke while wearing O2   SCHIZOPHRENIA, HX OF -He is not giving me an accurate history but prior notes mention that he has not received his Invega in quite a while -Psychiatry has started Prolixin and Cogentin in hopes that he may stabilize enough to go home - his sister feels that he will do well thie Prolixin as he has used it in the past and it was helping him remain stable   Involuntary commitment -No psychiatric facility has been found as of yet and will take him with his oxygen - per psych eval today, he is stable enough to go home and f/u as outpt  Thrombocytopenia -Platelets 160 on admission, now 148-last platelets and 2010 were 261  Polycythemia -Suspect this is secondary to chronic hypoxia-hemoglobin was 18.4 in 2009  Hypokalemia -Replaced  Elevated troponin -Most likely secondary to severe hypoxia-obtained EKG - shows NSR with right axis deviation -no complaints of chest pain   Abdominal distention - ultrasound of abdomen on 6/25 was unrevealing- see below Discharge Instructions  Discharge Instructions    Diet - low sodium heart healthy    Complete by:  As directed      Increase activity slowly    Complete by:  As directed             Medication List    STOP taking these medications        ibuprofen 200 MG tablet  Commonly known as:  ADVIL,MOTRIN      INVEGA SUSTENNA 117 MG/0.75ML Susp  Generic drug:  Paliperidone Palmitate      TAKE these medications        acetaminophen 500 MG tablet  Commonly known as:  TYLENOL  Take 500 mg by mouth every 6 (six) hours as needed (for pain.).     benztropine 0.5 MG tablet  Commonly known as:  COGENTIN  Take 1 tablet (0.5 mg total) by mouth 2 (two) times daily.     fluPHENAZine 5 MG tablet  Commonly known as:  PROLIXIN  Take 1 tablet (5 mg total) by mouth 2 (two) times daily.     furosemide 40 MG tablet  Commonly known as:  LASIX  Take 40 mg by mouth 2 (two) times daily.     lisinopril 30 MG tablet  Commonly known as:  PRINIVIL,ZESTRIL  Take 30 mg by mouth daily.     Vitamin D (Ergocalciferol) 50000 units Caps capsule  Commonly known as:  DRISDOL  Take 50,000 Units by mouth once a week.        Allergies  Allergen Reactions  . Asa [Aspirin] Other (See Comments)    As a child unsure of reaction  . Penicillins Other (See Comments)    Pt cannot remember type of reaction Has patient had a PCN reaction causing immediate rash, facial/tongue/throat swelling, SOB or lightheadedness with hypotension:unsure Has patient had a PCN reaction causing severe rash involving mucus membranes or skin necrosis:unsure Has patient had a PCN reaction that required hospitalization:unsure Has patient had a PCN reaction occurring within the last 10 years:does not think so If all of the above answers are "NO", then may proceed with Cephalosporin use.      Procedures/Studies: Dg Chest 2 View  08/27/2015  CLINICAL DATA:  Acute onset of hypoxia and cough. Initial encounter. EXAM: CHEST  2 VIEW COMPARISON:  Chest radiograph from 05/30/2015 FINDINGS: The lungs are well-aerated. Vascular congestion is noted. Increased interstitial markings raise concern for mild pulmonary edema. Small bilateral pleural effusions are seen. No pneumothorax is seen. The cardiomediastinal silhouette is mildly enlarged. No acute  osseous abnormalities are identified. IMPRESSION: Vascular congestion and mild cardiomegaly. Increased interstitial markings raise concern for mild pulmonary edema. Small bilateral pleural effusions seen. Electronically Signed   By: Garald Balding M.D.   On: 08/27/2015 23:39   Ct Angio Chest Pe W/cm &/or Wo Cm  08/28/2015  CLINICAL DATA:  60 year old male with hypoxia and shortness of breath EXAM: CT ANGIOGRAPHY CHEST WITH CONTRAST TECHNIQUE: Multidetector CT imaging of the chest was performed using the standard protocol during bolus administration of intravenous contrast. Multiplanar CT image reconstructions and MIPs were obtained to evaluate the vascular anatomy. CONTRAST:  100 cc Isovue 370 COMPARISON:  Chest radiograph dated 08/27/2015 FINDINGS: Evaluation of this exam is limited due to respiratory motion artifact. There is emphysematous changes of the lungs with chronic interstitial coarsening. There is no focal consolidation, pleural effusion, or pneumothorax. The central airways are patent. The thoracic aorta appears unremarkable. The origins of the great vessels of the aortic arch  appear patent. No CT evidence of pulmonary embolism. Top-normal cardiac size. No pericardial effusion. There is coronary vascular calcification. Bilateral hilar adenopathy of indeterminate clinical significance. Although these may be reactive or related to underlying granulomatous disease, the possibility of metastatic disease is not excluded. Clinical correlation is recommended. The esophagus is grossly unremarkable. No thyroid nodules identified. There is no axillary adenopathy. There is diffuse subcutaneous stranding and edema of the lower chest wall. There is mild degenerative changes of the spine. No acute fracture. Partially visualized small amount of ascites in the upper abdomen. An ill-defined linear 5.3 cm hypodensity in the right lobe of the liver is not well characterized but may represent vascular structure. Review  of the MIP images confirms the above findings. IMPRESSION: No CT evidence of pulmonary embolism. Emphysema with chronic interstitial scarring. Bilateral hilar adenopathy of indeterminate clinical significance. Clinical correlation is recommended new Partially visualized ascites. Electronically Signed   By: Anner Crete M.D.   On: 08/28/2015 02:51   US Abdomen Complete  08/28/2015  CLINICAL DATA:  Abdominal distension. EXAM: ABDOMEN ULTRASOUND COMPLETE COMPARISON:  None. FINDINGS: Gallbladder: No renal stones, sludge, Murphy's sign, or pericholecystic fluid. There is gallbladder wall thickening measuring 3.8 mm. The gallbladder is poorly distended. Common bile duct: Diameter: 3.6 mm. Liver: There is a small amount of ascites adjacent to the liver. No focal masses are seen within the liver. The sonographer described a mildly nodular contour to the liver which is not well appreciated on provided images or on the CT scan from earlier today. IVC: No abnormality visualized. Pancreas: Visualized portion unremarkable. Spleen: Small hyperechoic foci in the spleen may represent small granulomas, not seen on the visualized portions of the spleen from earlier today. Right Kidney: Length: 13.4 cm. Echogenicity within normal limits. No mass or hydronephrosis visualized. Left Kidney: Length: 13.2 cm. Echogenicity within normal limits. No mass or hydronephrosis visualized. Abdominal aorta: No aneurysm visualized. Other findings: None. IMPRESSION: 1. Gallbladder wall thickening and poor distention of the gallbladder with no stones, sludge, Murphy's sign, or pericholecystic fluid. 2. Mild ascites adjacent to the liver. 3. No other significant abnormalities. Electronically Signed   By: Dorise Bullion III M.D   On: 08/28/2015 16:16       Discharge Exam: Filed Vitals:   09/01/15 0510 09/01/15 1312  BP: 106/61 112/69  Pulse: 76 81  Temp: 97.8 F (36.6 C) 97.9 F (36.6 C)  Resp: 20 22   Filed Vitals:   08/31/15  2155 09/01/15 0510 09/01/15 1312 09/01/15 1314  BP: 96/65 106/61 112/69   Pulse: 80 76 81   Temp: 98.4 F (36.9 C) 97.8 F (36.6 C) 97.9 F (36.6 C)   TempSrc: Oral Oral Oral   Resp: 22 20 22    Height:      Weight:  108.727 kg (239 lb 11.2 oz)    SpO2: 93% 93% 86% 92%    General: Pt is alert, awake, not in acute distress Cardiovascular: RRR, S1/S2 +, no rubs, no gallops Respiratory: CTA bilaterally, no wheezing, no rhonchi Abdominal: Soft, NT, ND, bowel sounds + Extremities: no edema, no cyanosis- ++ clubbing    The results of significant diagnostics from this hospitalization (including imaging, microbiology, ancillary and laboratory) are listed below for reference.     Microbiology: No results found for this or any previous visit (from the past 240 hour(s)).   Labs: BNP (last 3 results)  Recent Labs  08/27/15 2255  BNP [id number]*   Basic Metabolic Panel:  Recent Labs  Lab 08/27/15 2255 08/29/15 0434 08/30/15 0455 08/31/15 0508  NA 135 137 135 134*  K 4.2 3.1* 3.1* 3.6  CL 101 91* 85* 89*  CO2 27 37* 39* 36*  GLUCOSE 89 96 89 90  BUN 7 8 14 15   CREATININE 0.72 0.57* 0.74 0.71  CALCIUM 8.6* 8.7* 9.0 8.8*   Liver Function Tests:  Recent Labs Lab 08/27/15 2255  AST 28  ALT 18  ALKPHOS 55  BILITOT 0.9  PROT 6.7  ALBUMIN 3.1*   No results for input(s): LIPASE, AMYLASE in the last 168 hours. No results for input(s): AMMONIA in the last 168 hours. CBC:  Recent Labs Lab 08/27/15 2255 08/28/15 0450  WBC 8.4 6.7  NEUTROABS 5.6 4.6  HGB 17.2* 18.3*  HCT 52.3* 55.6*  MCV 92.6 94.4  PLT 160 148*   Cardiac Enzymes:  Recent Labs Lab 08/28/15 0450  TROPONINI 0.13*   BNP: Invalid input(s): POCBNP CBG: No results for input(s): GLUCAP in the last 168 hours. D-Dimer No results for input(s): DDIMER in the last 72 hours. Hgb A1c No results for input(s): HGBA1C in the last 72 hours. Lipid Profile No results for input(s): CHOL, HDL, LDLCALC, TRIG,  CHOLHDL, LDLDIRECT in the last 72 hours. Thyroid function studies No results for input(s): TSH, T4TOTAL, T3FREE, THYROIDAB in the last 72 hours.  Invalid input(s): FREET3 Anemia work up No results for input(s): VITAMINB12, FOLATE, FERRITIN, TIBC, IRON, RETICCTPCT in the last 72 hours. Urinalysis    Component Value Date/Time   COLORURINE YELLOW 08/28/2015 0350   APPEARANCEUR CLEAR 08/28/2015 0350   LABSPEC 1.016 08/28/2015 0350   PHURINE 6.0 08/28/2015 0350   GLUCOSEU NEGATIVE 08/28/2015 0350   HGBUR NEGATIVE 08/28/2015 0350   BILIRUBINUR NEGATIVE 08/28/2015 0350   KETONESUR NEGATIVE 08/28/2015 0350   PROTEINUR NEGATIVE 08/28/2015 0350   NITRITE NEGATIVE 08/28/2015 0350   LEUKOCYTESUR NEGATIVE 08/28/2015 0350   Sepsis Labs Invalid input(s): PROCALCITONIN,  WBC,  LACTICIDVEN Microbiology No results found for this or any previous visit (from the past 240 hour(s)).   Time coordinating discharge: Over 30 minutes  SIGNED:   Debbe Odea, MD  [facility] 09/01/2015, 2:32 PM Pager   If 7PM-7AM, please contact night-coverage [email or web address] Password [email or web address]

## 2015-09-01 NOTE — Progress Notes (Addendum)
Update:Patient seen by Psych. MD. Patient has been cleared to discharge home. Patient will follow up with [facility] to continue medications. Patient sister Gwinda Passe aware of patient DC. Patient IVC paper work  rescinded signed by Duane Lope. MD.  No other needs identified at this time.   Kathrin Greathouse, Latanya Presser, MSW Clinical Social Worker 5E and Psychiatric Service Line [phone] 09/01/2015  1:18 PM

## 2015-09-01 NOTE — Discharge Instructions (Signed)
Do not smoke- especially do not smoke while wearing Oxygen as it will burn you.

## 2015-09-01 NOTE — Progress Notes (Signed)
SATURATION QUALIFICATIONS: (This note is used to comply with regulatory documentation for home oxygen)  Patient Saturations on Room Air at Rest = 86%  Patient Saturations on Room Air while Ambulating =   Patient Saturations on  Liters of oxygen while Ambulating =  Please briefly explain why patient needs home oxygen: 

## 2015-09-01 NOTE — Progress Notes (Signed)
Spoke with pt's sister concerning discharge plan and [place] needs.  Home O2 will come from [facility], sister is ok with that. At present time there are no other needs. Plan to discharge today.

## 2015-09-01 NOTE — Consult Note (Signed)
[facility] Face-to-Face Psychiatry Consult Follow Up  Reason for Consult:  Schizophrenia Referring Physician:  Dr. Venetia Constable Patient Identification: Robert Griffin MRN:  [id number] Principal Diagnosis: Acute diastolic (congestive) heart failure [facility]) Diagnosis:   Patient Active Problem List   Diagnosis Date Noted  . Respiratory failure with hypoxia and hypercapnia (HCC) [J96.91, J96.92] 08/31/2015  . Heart failure ([place]) [I50.9] 08/28/2015  . Acute diastolic (congestive) heart failure ([place]) [I50.31] 08/28/2015  . Involuntary commitment [Z04.6] 08/28/2015  . Acute respiratory failure with hypoxia ([place]) [J96.01] 08/28/2015  . Pain in the chest [R07.9] 06/10/2015  . Abnormal EKG [R94.31] 06/10/2015  . Medical certificate issuance [Z02.79] 04/04/2012  . SCHIZOPHRENIA, HX OF [Z86.59] 07/01/2008  . CARCINOMA, BASAL CELL [C44.90] 05/06/2008  . TOBACCO ABUSE [F17.200] 04/19/2008  . Varicose veins of lower extremity with edema [I83.899] 04/19/2008    Total Time spent with patient: 30 minutes  Subjective:   Robert Griffin is a 60 y.o. male patient admitted with Congestive heart failure and schizophrenia.  HPI:  This patient is a 60 year old single white male who lives alone in [place]. He is a poor historian but his sister Gwinda Passe was contacted for [place] to this information.  Apparently the patient has had a history of schizophrenia since he was approximately 60 years old. He began with auditory hallucinations paranoia and delusions. He has been on antipsychotics most of his life. He is followed at [facility] and was getting Prolixin injections and did well for number of years. About 2 years ago he was switched to [place] injections. His sister said that this medication did not work for him and he became increasingly psychotic. In April he stopped going to [facility] to get his shots altogether. Since then he has deteriorated. He has not been taking either his psychiatric nor his other medications. He is been  barricading himself in his house and is very paranoid. He has covers over the doors and even the TV. He has been calling the sheriff stating that people are stealing his things. He hears voices and sees things. His mental illness has affected his congestive heart failure as he is not taking his medicine and has developed hypoxia. His sister finally filed involuntary petition for commitment.  Today the patient is lying in bed and makes poor eye contact. His abdomen is quite distended. He does have ascites and drinks fairly heavily, at least 6 beers a day. He also admits to smoking marijuana but his urine drug screen is negative. He is irritable but denies auditory or visual hallucinations at the moment. He denies being paranoid. He's primarily tired and doesn't want to talk. He denies any thoughts of wanting to hurt self or others.  Past Psychiatric History: Long term treatment for schizophrenia, no recent hospitalizations  Interval history: Patient seen for face to face psych consultation follow up along with psych LCSW and case discussed with staff RN, who confirmed that he has received Proloxin Dec 25 mg IM on 6/25 and has been compliant with his oral medication. He is sitting in his bed, on oxygen supply by [place] and denied disturbance of sleep and appetite. He has no suicide or homicide ideation. He has no auditory or visual hallucinations. He continue to have paranoid delusions that his neighbors coming and stealing his antique bottle etc. He seems to be at base line at this time and his sister agree to care for him at out patient at this time. He has brother who can help some times.  Risk to Self: Is patient at risk for suicide?: No, but patient needs Medical Clearance Risk to Others:   Prior Inpatient Therapy:   Prior Outpatient Therapy:    Past Medical History:  Past Medical History  Diagnosis Date  . History of schizophrenia     [facility]  . Skin cancer of trunk 2010    shoulder, BCC  s/p derm removal  . Smoker     Chronic long-term smoker  . Hiatal hernia   . 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 Recently started on Lasix.  . Pulmonary hypertension ([place]) 06/2015    by echo    Past Surgical History  Procedure Laterality Date  . Skin cancer excision  2011    on back  . Left wrist sugery  1980s    hand through glass, tendon reconstruction   Family History:  Family History  Problem Relation Age of Onset  . Cancer Mother     ?ovarian  . Cancer Father 10    prostate cancer  . Mental illness Paternal Grandfather   . Coronary artery disease Neg Hx   . Stroke Neg Hx   . Diabetes Neg Hx   . Hyperlipidemia Neg Hx   . Alcohol abuse Neg Hx   . Arthritis Neg Hx    Family Psychiatric  History: Paternal grandfather had schizophrenia and died in a psychiatric facility Social History:  History  Alcohol Use  . 0.0 oz/week  . 0 Standard drinks or equivalent per week    Comment: (6-12 beers/week)     History  Drug Use  . Yes  . Special: Marijuana    Comment: MJ    Social History   Social History  . Marital Status: Single    Spouse Name: N/A  . Number of Children: N/A  . Years of Education: N/A   Occupational History  . unemployed    Social History Main Topics  . Smoking status: Current Every Day Smoker -- 1.00 packs/day for 40 years    Types: Cigarettes  . Smokeless tobacco: Never Used     Comment: He rolls his own cigarettes - both tobacco and marijuana. No interest in stopping  . Alcohol Use: 0.0 oz/week    0 Standard drinks or equivalent per week     Comment: (6-12 beers/week)  . Drug Use: Yes    Special: Marijuana     Comment: MJ  . Sexual Activity: Not Asked   Other Topics Concern  . None   Social History Narrative   Caffeine: 2 cups coffee, 3 cups tea daily   Lives alone, 1 dog   Family in area - sister brought him today. Has 3 brothers and 3 sisters   Occupation: unemployed, was  painting   9th grade education   Activity: no regular exercise   Diet: likes vegetables, good amt water         Additional Social History:    Allergies:   Allergies  Allergen Reactions  . Asa [Aspirin] Other (See Comments)    As a child unsure of reaction  . Penicillins Other (See Comments)    Pt cannot remember type of reaction Has patient had a PCN reaction causing immediate rash, facial/tongue/throat swelling, SOB or lightheadedness with hypotension:unsure Has patient had a PCN reaction causing severe rash involving mucus membranes or skin necrosis:unsure Has patient had a PCN reaction that required hospitalization:unsure Has patient had a PCN reaction occurring within the last 10  years:does not think so If all of the above answers are "NO", then may proceed with Cephalosporin use.     Labs:  Results for orders placed or performed during the hospital encounter of 08/27/15 (from the past 48 hour(s))  Basic metabolic panel     Status: Abnormal   Collection Time: 08/31/15  5:08 AM  Result Value Ref Range   Sodium 134 (L) 135 - 145 mmol/L   Potassium 3.6 3.5 - 5.1 mmol/L   Chloride 89 (L) 101 - 111 mmol/L   CO2 36 (H) 22 - 32 mmol/L   Glucose, Bld 90 65 - 99 mg/dL   BUN 15 6 - 20 mg/dL   Creatinine, Ser 0.71 0.61 - 1.24 mg/dL   Calcium 8.8 (L) 8.9 - 10.3 mg/dL   GFR calc non Af Amer >60 >60 mL/min   GFR calc Af Amer >60 >60 mL/min    Comment: (NOTE) The eGFR has been calculated using the CKD EPI equation. This calculation has not been validated in all clinical situations. eGFR's persistently <60 mL/min signify possible Chronic Kidney Disease.    Anion gap 9 5 - 15    Current Facility-Administered Medications  Medication Dose Route Frequency Provider Last Rate Last Dose  . 0.9 %  sodium chloride infusion  250 mL Intravenous PRN Norval Morton, MD      . acetaminophen (TYLENOL) tablet 650 mg  650 mg Oral Q4H PRN Norval Morton, MD      . benztropine (COGENTIN)  tablet 0.5 mg  0.5 mg Oral BID Cloria Spring, MD   0.5 mg at 09/01/15 0924  . enoxaparin (LOVENOX) injection 55 mg  0.5 mg/kg Subcutaneous Q24H Debbe Odea, MD   55 mg at 09/01/15 0927  . fluPHENAZine (PROLIXIN) tablet 5 mg  5 mg Oral BID Cloria Spring, MD   5 mg at 09/01/15 1610  . folic acid (FOLVITE) tablet 1 mg  1 mg Oral Daily Rondell A Tamala Julian, MD   1 mg at 09/01/15 9604  . furosemide (LASIX) tablet 40 mg  40 mg Oral BID Charlynne Cousins, MD   40 mg at 09/01/15 5409  . haloperidol lactate (HALDOL) injection 2 mg  2 mg Intravenous Q6H PRN Charlynne Cousins, MD      . ipratropium-albuterol (DUONEB) 0.5-2.5 (3) MG/3ML nebulizer solution 3 mL  3 mL Nebulization Q6H PRN Charlynne Cousins, MD      . lisinopril (PRINIVIL,ZESTRIL) tablet 30 mg  30 mg Oral Daily Norval Morton, MD   30 mg at 08/28/15 1006  . multivitamin with minerals tablet 1 tablet  1 tablet Oral Daily Norval Morton, MD   1 tablet at 09/01/15 0925  . ondansetron (ZOFRAN) injection 4 mg  4 mg Intravenous Q6H PRN Rondell A Tamala Julian, MD      . potassium chloride SA (K-DUR,KLOR-CON) CR tablet 40 mEq  40 mEq Oral BID Charlynne Cousins, MD   40 mEq at 09/01/15 0925  . sodium chloride flush (NS) 0.9 % injection 3 mL  3 mL Intravenous Q12H Norval Morton, MD   3 mL at 08/31/15 2347  . sodium chloride flush (NS) 0.9 % injection 3 mL  3 mL Intravenous PRN Norval Morton, MD      . thiamine (VITAMIN B-1) tablet 100 mg  100 mg Oral Daily Norval Morton, MD   100 mg at 09/01/15 8119   Or  . thiamine (B-1) injection 100 mg  100 mg Intravenous  Daily Norval Morton, MD      . Vitamin D (Ergocalciferol) (DRISDOL) capsule 50,000 Units  50,000 Units Oral Weekly Norval Morton, MD   50,000 Units at 08/29/15 1000    Musculoskeletal: Strength & Muscle Tone: within normal limits Gait & Station: unsteady Patient leans: N/A  Psychiatric Specialty Exam: Physical Exam  Review of Systems  Respiratory: Positive for shortness of breath.    Psychiatric/Behavioral: Positive for memory loss. The patient is nervous/anxious.     Blood pressure 106/61, pulse 76, temperature 97.8 F (36.6 C), temperature source Oral, resp. rate 20, height _0  (1.778 m), weight 108.727 kg (239 lb 11.2 oz), SpO2 93 %.Body mass index is 34.39 kg/(m^2).  General Appearance: Casual  Eye Contact:  Good  Speech:  Slurred  Volume:  Normal  Mood:  Euthymic  Affect:  Constricted  Thought Process:  Coherent  Orientation:  Full (Time, Place, and Person)  Thought Content:  Logical and Paranoid Ideation which seems to be base line  Suicidal Thoughts:  No  Homicidal Thoughts:  No  Memory:  Immediate;   Fair Recent;   Fair Remote;   Fair  Judgement:  Fair  Insight:  Shallow  Psychomotor Activity:  Decreased  Concentration:  Concentration: Fair and Attention Span: Fair  Recall:  [employer] of Knowledge:  Fair  Language:  Fair  Akathisia:  No  Handed:  Right  AIMS (if indicated):     Assets:  Armed forces logistics/support/administrative officer Social Support  ADL's:  Intact  Cognition:  WNL  Sleep:        Treatment Plan Summary: Daily contact with patient to assess and evaluate symptoms and progress in treatment and Medication management  He was given Prolixin [place] 25 mg IM on 08/28/2015 and needs monthly injections at [employer] Prolixin 5 mg BID for paranoid psychosis Continue Cogentin 0.5 mg PO BID for EPS Patient has no safety issues Rescind IVC and refer to out patient medication management and possible ACT services at [place] psychiatric consultation and we sign off as of today Please contact [phone] or [phone] if needs further assistance  Disposition: Patient does not meet criteria for psychiatric inpatient admission. Supportive therapy provided about ongoing stressors.    Ambrose Finland, MD 09/01/2015 12:40 PM

## 2015-09-05 ENCOUNTER — Telehealth: Payer: Self-pay

## 2015-09-05 NOTE — Telephone Encounter (Signed)
1st TCM outreach - no voicemail; unable to leave message

## 2015-09-07 NOTE — Telephone Encounter (Signed)
I have not seen patient since 2014. PCP listed in chart as Dr Tamsen Roers.  Can we touch base with sister Mikael Spray to verify whether pt wants to continue coming here or go to see Dr Rex Kras as PCP?

## 2015-09-07 NOTE — Telephone Encounter (Signed)
Transition Care Management Follow-up Telephone Call    Date discharged? 09/01/2015   How have you been since you were released from the hospital? Recovering; no issues or concerns at this time   Do you understand why you were in the hospital? Yes   Do you understand the discharge instructions? Yes   Where were you discharged to? Home   Items Reviewed:  Medications reviewed: Yes  Allergies reviewed: N/A  Dietary changes reviewed: Yes  Referrals reviewed: Yes   Functional Questionnaire:   Activities of Daily Living (ADLs):   Pt is independent with ADLs. Pt is unable to drive.    Confirmed importance and date/time of follow-up visits scheduled YES  Provider Appointment booked 09/15/15 @ 1600  Confirmed with patient if condition begins to worsen call PCP or go to the ER.  Patient was given the office number and encouraged to call back with question or concerns.  : YES

## 2015-09-14 NOTE — Telephone Encounter (Signed)
Per pt, Dr. Rex Kras is his PCP.

## 2015-09-15 ENCOUNTER — Ambulatory Visit: Payer: Medicaid Other | Admitting: Family Medicine

## 2015-09-27 ENCOUNTER — Encounter: Payer: Self-pay | Admitting: Internal Medicine

## 2015-09-27 ENCOUNTER — Ambulatory Visit (INDEPENDENT_AMBULATORY_CARE_PROVIDER_SITE_OTHER): Payer: Medicaid Other | Admitting: Internal Medicine

## 2015-09-27 VITALS — BP 138/80 | HR 108 | Ht 70.5 in | Wt 243.2 lb

## 2015-09-27 DIAGNOSIS — J449 Chronic obstructive pulmonary disease, unspecified: Secondary | ICD-10-CM

## 2015-09-27 DIAGNOSIS — J8410 Pulmonary fibrosis, unspecified: Secondary | ICD-10-CM

## 2015-09-27 DIAGNOSIS — I10 Essential (primary) hypertension: Secondary | ICD-10-CM

## 2015-09-27 DIAGNOSIS — J9611 Chronic respiratory failure with hypoxia: Secondary | ICD-10-CM | POA: Diagnosis not present

## 2015-09-27 DIAGNOSIS — Z720 Tobacco use: Secondary | ICD-10-CM | POA: Diagnosis not present

## 2015-09-27 DIAGNOSIS — F17210 Nicotine dependence, cigarettes, uncomplicated: Secondary | ICD-10-CM

## 2015-09-27 DIAGNOSIS — J9612 Chronic respiratory failure with hypercapnia: Secondary | ICD-10-CM

## 2015-09-27 MED ORDER — IRBESARTAN 150 MG PO TABS: 150.0000 mg | ORAL_TABLET | Freq: Every day | ORAL | 11 refills | Status: DC

## 2015-09-27 NOTE — Assessment & Plan Note (Signed)
Spirometry 09/27/2015  FEV1 1.95 (52%)  Ratio 76 and mostly abn in effort dep portion where there is def plateau with min curvature in effort indep    Symptoms are markedly disproportionate to objective findings and not clear this is a lung problem but pt does appear to have difficult airway management issues. DDX of  difficult airways management almost all start with A and  include Adherence, Ace Inhibitors, Acid Reflux, Active Sinus Disease, Alpha 1 Antitripsin deficiency, Anxiety masquerading as Airways dz,  ABPA,  Allergy(esp in young), Aspiration (esp in elderly), Adverse effects of meds,  Active smokers, A bunch of PE's (a small clot burden can't cause this syndrome unless there is already severe underlying pulm or vascular dz with poor reserve) plus two Bs  = Bronchiectasis and Beta blocker use..and one C= CHF  Adherence is always the initial "prime suspect" and is a multilayered concern that requires a "trust but verify" approach in every patient - starting with knowing how to use medications, especially inhalers, correctly, keeping up with refills and understanding the fundamental difference between maintenance and prns vs those medications only taken for a very short course and then stopped and not refilled.  - very confused with meds. Sister, not present, obtains them for him - brother brought him in today  Active smoking (see separate a/p)   ? acei effects > needs try off   ? Anxiety > usually at the bottom of this list of usual suspects but should be much higher on this pt's based on H and P and note already on psychotropics .   ? chf > echo 06/10/15  suggests mostly cor pulmonale but note LA and RA equally dilated and would need RHC to sort out. For now rx is the same = 02  Total time devoted to counseling  = 35/3m review case with pt/brother discussion of options/alternatives/ personally creating written instructions  in presence of pt  then going over those specific  Instructions  directly with the pt including how to use all of the meds but in particular covering each new medication in detail and the difference between the maintenance/automatic meds and the prns using an action plan format for the latter.

## 2015-09-27 NOTE — Assessment & Plan Note (Addendum)
02 at d/c from [facility] 09/01/15  But on arrival to first pulmonary ov 09/27/2015 sats 84% corrected to 91% on 2lpm   As of 09/27/2015 rec 2lpm 24/7

## 2015-09-27 NOTE — Patient Instructions (Addendum)
The key is to stop smoking completely before smoking completely stops you!   Stop lisinopril and start avapro 150 mg daily   02 2lpm 24/7  Please schedule a follow up office visit in 6 weeks, call sooner if needed with pfts and cxr ok to move back

## 2015-09-27 NOTE — Assessment & Plan Note (Signed)
Changed lisinopril to ARB 09/27/2015 due to hoarseness and upper airway cough  In the best review of chronic cough to date ( NEJM 2016 375 [id number]) ,  ACEi are now felt to cause cough in up to  20% of pts which is a 4 fold increase from previous reports and does not include the variety of non-specific complaints we see in pulmonary clinic in pts on ACEi but previously attributed to another dx like  Copd/asthma and  include PNDS, throat and chest congestion, "bronchitis", unexplained dyspnea and noct "strangling" sensations, and hoarseness, but also  atypical /refractory GERD symptoms like dysphagia and "bad heartburn"   The only way I know  to prove this is not an "ACEi Case" is a trial off ACEi x a minimum of 6 weeks then regroup.   Try avapro 150 mg daily in place of lisinopril 30 mg daily

## 2015-09-27 NOTE — Progress Notes (Signed)
Subjective:    Patient ID: Robert Griffin, male    DOB: 04-29-1955, 60 y.o.   MRN: [id number]  HPI  59 yowm active smoker onset of sob late in 2016 sp admit   Admit date:     08/27/15  Discharge date: 09/01/2015  Admitted From: home  Disposition:  home   Recommendations for Outpatient Follow-up:  1. Follow up with PCP in 1-2 weeks 2. Follow up with pulmonary- needs PFTs and sleep study 3. Still needs right heart  Home Health:  RN  Equipment/Devices:  O2  Discharge Condition:  stable  CODE STATUS:  Full code   Diet recommendation:  Heart healthy Consultations:    Discharge Diagnoses:  Principal Problem:   Acute cor pulmonale (HCC) Active Problems:  Hypercarbic and hypoxic respiratory failure   TOBACCO ABUSE   SCHIZOPHRENIA, HX OF   Involuntary commitment   Schizophrenia ([place])   Noncompliance with medication regimen   Subjective: 60 year old schizophrenic, smoker with recently diagnosed right heart failure suspected to be secondary to pulmonary hypertension, possible lower extremity venous stasis who was brought to the hospital under involuntary commitment. Apparently his sister initiated this because he had "barricaded" himself and his house and had not been taking his medications. The patient was transferred to my service on 08/31/15.    Brief Summary: Principal Problem: Hypercarbic and hypoxic respiratory failure- cor pulmonale -Suspect this is chronic and has just been discovered- I suspect his polycythemia is secondary to chronic hypoxia- - he has clubbing of his fingernails -CT scan does not show any evidence of heart failure but does show pulmonary fibrosis  -Was noted to be hypoxic with a pulse ox in the 70s-there is no prior record of what his prior oxygen saturations have been -I suspect that he has chronic hypoxia from pulmonary fibrosis which may be related to smoking    heart failure is mostly right heart failure -last echo showed normal LV  function and normal relaxation but did show elevated RV pressures adjusting pulmonary hypertension-the level of pulmonary hypertension was not able to be calculated-he needs a right heart cath, pulmonary function tests and a sleep study to assess for sleep apnea - He is currently on his home dose of furosemide at 40 mg twice a day-he received 1 dose of IV Lasix on 6/25 and it was then subsequently changed to an infusion-weight has improved from 129 tp 108 kg - plan discussed in detail with patient - have also had a discussion with his sister about the plan  Active Problems:   TOBACCO ABUSE -strictly advised to discontinue especially advised not to smoke while wearing O2   SCHIZOPHRENIA, HX OF -He is not giving me an accurate history but prior notes mention that he has not received his Invega in quite a while -Psychiatry has started Prolixin and Cogentin in hopes that he may stabilize enough to go home - his sister feels that he will do well thie Prolixin as he has used it in the past and it was helping him remain stable   Involuntary commitment -No psychiatric facility has been found as of yet and will take him with his oxygen - per psych eval today, he is stable enough to go home and f/u as outpt  Thrombocytopenia -Platelets 160 on admission, now 148-last platelets and 2010 were 261  Polycythemia -Suspect this is secondary to chronic hypoxia-hemoglobin was 18.4 in 2009  Hypokalemia -Replaced  Elevated troponin -Most likely secondary to severe hypoxia-obtained EKG - shows NSR  with right axis deviation -no complaints of chest pain   Abdominal distention - ultrasound of abdomen on 6/25 was unrevealing- see below    09/27/2015 1st [facility] office visit/ Tomie Spizzirri   Chief Complaint  Patient presents with  . Pulmonary Consult    Referred by Dr. Wynelle Cleveland. Pt c/o SOB for the past 6 wks. He gets winded walking to his mailbox and back.  He also c/o occ cough with clear  sputum.     Doe = MMRC3 = can't walk 100 yards even at a slow pace at a flat grade s stopping due to sob  Onset was x 6 months progressive decline best day walking due to sob esp worse x 6 weeks pta but much better since d/c on 02 when he remember to wear it.  Lots of cough/ upper airway features with hoarseness, non productive day > noct and no am excess or purulent mucus  No obvious  patterns in day to day or daytime variabilty or assoc cp or chest tightness, subjective wheeze overt sinus or hb symptoms. No unusual exp hx or h/o childhood pna/ asthma or knowledge of premature birth.  Sleeping ok without nocturnal  or early am exacerbation  of respiratory  c/o's or need for noct saba. Also denies any obvious fluctuation of symptoms with weather or environmental changes or other aggravating or alleviating factors except as outlined above   Current Medications, Allergies, Complete Past Medical History, Past Surgical History, Family History, and Social History were reviewed in [employer] record.           Review of Systems  Constitutional: Negative for activity change, appetite change, chills, fever and unexpected weight change.  HENT: Negative for congestion, dental problem, postnasal drip, rhinorrhea, sneezing, sore throat, trouble swallowing and voice change.   Eyes: Negative for visual disturbance.  Respiratory: Positive for cough and shortness of breath. Negative for choking.   Cardiovascular: Negative for chest pain and leg swelling.  Gastrointestinal: Negative for abdominal pain, nausea and vomiting.  Genitourinary: Negative for difficulty urinating.  Musculoskeletal: Negative for arthralgias.  Skin: Negative for rash.  Psychiatric/Behavioral: Negative for behavioral problems and confusion.       Objective:   Physical Exam   Disheveled elderly wm >> stated age with gravely voice that is difficult to understand   Wt Readings from Last 3 Encounters:    09/27/15 243 lb 3.2 oz (110.3 kg)  09/01/15 239 lb 11.2 oz (108.7 kg)  06/08/15 248 lb 12 oz (112.8 kg)    Vital signs reviewed   HEENT: nl  turbinates, and oropharynx. Nl external ear canals without cough reflex - edentulous   NECK :  without JVD/Nodes/TM/ nl carotid upstrokes bilaterally   LUNGS: no acc muscle use,  Nl contour chest with a few insp crackles in bases  Bilaterally without exp wheeze and  without cough on insp or exp maneuvers   CV:  RRR  no s3 or murmur or increase in P2, 2+ bilateral pitting lower ext edema in elastic hose   ABD:  soft and nontender with nl inspiratory excursion in the supine position. No bruits or organomegaly, bowel sounds nl  MS:  Nl gait/ ext warm without deformities, calf tenderness, cyanosis / severe bil clubbing No obvious joint restrictions   SKIN: warm and dry without lesions    NEURO:  alert, approp, nl sensorium with  no motor deficits       I personally reviewed images and agree with radiology  impression as follows:  CT Chest   08/28/15 No CT evidence of pulmonary embolism. Emphysema with chronic interstitial scarring. Bilateral hilar adenopathy of indeterminate clinical significance. Clinical correlation is recommended new Partially visualized ascites.     Assessment & Plan:

## 2015-09-27 NOTE — Assessment & Plan Note (Signed)

## 2015-09-27 NOTE — Assessment & Plan Note (Signed)
DDx for pulmonary fibrosis  includes idiopathic pulmonary fibrosis, pulmonary fibrosis associated with rheumatologic diseases (which have a relatively benign course in most cases) , adverse effect from  drugs such as chemotherapy or amiodarone exposure, nonspecific interstitial pneumonia which is typically steroid responsive, and chronic hypersensitivity pneumonitis.   In active  smokers Langerhan's Cell  Histiocyctosis (eosinophilic granuomatosis),  DIP,  and Respiratory Bronchiolitis ILD also need to be considered,  And these are at the top of his list and demand full smoking cessation before proceeding with additional w/u other than have him return for full pfts

## 2015-09-28 ENCOUNTER — Telehealth: Payer: Self-pay | Admitting: Internal Medicine

## 2015-09-28 MED ORDER — VALSARTAN 320 MG PO TABS: 320.0000 mg | ORAL_TABLET | Freq: Every day | ORAL | 5 refills | Status: AC

## 2015-09-28 NOTE — Telephone Encounter (Signed)
Called and spoke with pts sister and she stated that the pts rx for the avalide was sent to the pharmacy yesterday and the pharmacy stated that they cannot fill this medication until a PA is done.  MW do you want to initiate the PA or change this medication?  Please advise. Thanks

## 2015-09-28 NOTE — Telephone Encounter (Signed)
Spoke with pt's sister and advised of medication change. She agreed. Rx sent. Nothing further needed.

## 2015-09-28 NOTE — Telephone Encounter (Signed)
Should be able to pick another ARB try diovan 320 mg daily

## 2015-09-29 ENCOUNTER — Telehealth: Payer: Self-pay | Admitting: Internal Medicine

## 2015-09-29 MED ORDER — LOSARTAN POTASSIUM 100 MG PO TABS: 100.0000 mg | ORAL_TABLET | Freq: Two times a day (BID) | ORAL | 0 refills | Status: DC

## 2015-09-29 NOTE — Telephone Encounter (Signed)
Losartan 100 mg twice daily should do but will need to recheck bp in one week

## 2015-09-29 NOTE — Telephone Encounter (Signed)
Pt having issues getting medications, spoke with pt sister Gwinda Passe, states that the Diovan 320mg  is not covered by insurance either. Needs something called in today so he can start taking his meds.  [facility]ed [facility] - Per the pharmacist Vicente Males, Losartan is a covered preferred medication.  Please advise Dr Melvyn Novas. Thanks.

## 2015-09-29 NOTE — Telephone Encounter (Signed)
Spoke with pt's spouse and notified of recs per MW  OV with TP scheduled for 10/07/15 Rx for losartan was sent to pharm

## 2015-10-03 ENCOUNTER — Telehealth: Payer: Self-pay | Admitting: *Deleted

## 2015-10-03 MED ORDER — LOSARTAN POTASSIUM 100 MG PO TABS: ORAL_TABLET | ORAL | 0 refills | Status: AC

## 2015-10-03 NOTE — Telephone Encounter (Signed)
Pharmacy left msg on primary care triage stating calling about rx that was sent on 7/27 for pt Losartan. Needing to verify dosage. Dosage is too high. Per chart rx was sent by Dr. Melvyn Novas will forward to pulmonology...Robert Griffin

## 2015-10-03 NOTE — Telephone Encounter (Signed)
This was to replace very large doses of lisinopril so shouldn't be a problem but pt can start with 100 mg daily and take twice daily if bp starts to climb - will need to monitor carefully either way eg weekly here or PC or local drugstore if doesn't have a way of doing this at home

## 2015-10-03 NOTE — Telephone Encounter (Signed)
[facility] [place]. Rx will need to resent with the instructions below. Rx has been sent in. Nothing further was needed.

## 2015-10-04 ENCOUNTER — Telehealth: Payer: Self-pay | Admitting: Adult Health

## 2015-10-04 NOTE — Telephone Encounter (Signed)
Spoke with pt's sister and she states that pt started Losartan yesterday. She wanted to know if appt for 10/07/15 should be moved out some. I offered to move appt out to next Monday, 10/10/15, but she states that they cannot bring pt that week and it would have to be the next week. In that case I advised her to keep appt for 10/07/15 as moving it out 2 weeks would be too far, MW wanted pt seen 1 week after starting new med. She will keep appt as is. Nothing further needed.

## 2015-10-07 ENCOUNTER — Encounter: Payer: Self-pay | Admitting: Adult Health

## 2015-10-07 ENCOUNTER — Ambulatory Visit (INDEPENDENT_AMBULATORY_CARE_PROVIDER_SITE_OTHER): Payer: Medicaid Other | Admitting: Adult Health

## 2015-10-07 DIAGNOSIS — J449 Chronic obstructive pulmonary disease, unspecified: Secondary | ICD-10-CM

## 2015-10-07 DIAGNOSIS — J8410 Pulmonary fibrosis, unspecified: Secondary | ICD-10-CM

## 2015-10-07 DIAGNOSIS — I10 Essential (primary) hypertension: Secondary | ICD-10-CM

## 2015-10-07 MED ORDER — TIOTROPIUM BROMIDE MONOHYDRATE 2.5 MCG/ACT IN AERS: 2.0000 | INHALATION_SPRAY | Freq: Every day | RESPIRATORY_TRACT | 5 refills | Status: AC

## 2015-10-07 NOTE — Patient Instructions (Addendum)
Great job and not smoking Begin Spiriva Respimat 2 puffs daily, rinse after use Follow up with Primary Doctor regarding blood pressure management . Avoid ACE inhibitor if able -they can make you cough.  Follow up with Dr. Melvyn Novas  In 3 months and As needed   Please contact office for sooner follow up if symptoms do not improve or worsen or seek emergency care

## 2015-10-07 NOTE — Assessment & Plan Note (Signed)
Follow with PCP  Remain off ACE .

## 2015-10-07 NOTE — Assessment & Plan Note (Signed)
Smoking cessation encouraged.  Would consider repeat CT chest 6-50month  Check full PFT w/ dlco in 3 months

## 2015-10-07 NOTE — Assessment & Plan Note (Signed)
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 Avoid ACE inhibitor if able -they can make you cough.  Follow up with Dr. Melvyn Novas  In 3 months and As needed   Please contact office for sooner follow up if symptoms do not improve or worsen or seek emergency care

## 2015-10-07 NOTE — Progress Notes (Signed)
Subjective:    Patient ID: Robert Griffin, male    DOB: [birth year], 60 y.o.   MRN: [id number]  HPI  71 yowm active smoker onset of sob late in 2016 sp admit   Admit date:     08/27/15  Discharge date: 09/01/2015  Admitted From: home  Disposition:  home   Recommendations for Outpatient Follow-up:  1. Follow up with PCP in 1-2 weeks 2. Follow up with pulmonary- needs PFTs and sleep study 3. Still needs right heart  Home Health:  RN  Equipment/Devices:  O2  Discharge Condition:  stable  CODE STATUS:  Full code   Diet recommendation:  Heart healthy Consultations:    Discharge Diagnoses:  Principal Problem:   Acute cor pulmonale (HCC) Active Problems:  Hypercarbic and hypoxic respiratory failure   TOBACCO ABUSE   SCHIZOPHRENIA, HX OF   Involuntary commitment   Schizophrenia ([place])   Noncompliance with medication regimen   Subjective: 60 year old schizophrenic, smoker with recently diagnosed right heart failure suspected to be secondary to pulmonary hypertension, possible lower extremity venous stasis who was brought to the hospital under involuntary commitment. Apparently his sister initiated this because he had "barricaded" himself and his house and had not been taking his medications. The patient was transferred to my service on 08/31/15.    Brief Summary: Principal Problem: Hypercarbic and hypoxic respiratory failure- cor pulmonale -Suspect this is chronic and has just been discovered- I suspect his polycythemia is secondary to chronic hypoxia- - he has clubbing of his fingernails -CT scan does not show any evidence of heart failure but does show pulmonary fibrosis  -Was noted to be hypoxic with a pulse ox in the 70s-there is no prior record of what his prior oxygen saturations have been -I suspect that he has chronic hypoxia from pulmonary fibrosis which may be related to smoking    heart failure is mostly right heart failure -last echo showed normal LV  function and normal relaxation but did show elevated RV pressures adjusting pulmonary hypertension-the level of pulmonary hypertension was not able to be calculated-he needs a right heart cath, pulmonary function tests and a sleep study to assess for sleep apnea - He is currently on his home dose of furosemide at 40 mg twice a day-he received 1 dose of IV Lasix on 6/25 and it was then subsequently changed to an infusion-weight has improved from 129 tp 108 kg - plan discussed in detail with patient - have also had a discussion with his sister about the plan  Active Problems:   TOBACCO ABUSE -strictly advised to discontinue especially advised not to smoke while wearing O2   SCHIZOPHRENIA, HX OF -He is not giving me an accurate history but prior notes mention that he has not received his Invega in quite a while -Psychiatry has started Prolixin and Cogentin in hopes that he may stabilize enough to go home - his sister feels that he will do well thie Prolixin as he has used it in the past and it was helping him remain stable   Involuntary commitment -No psychiatric facility has been found as of yet and will take him with his oxygen - per psych eval today, he is stable enough to go home and f/u as outpt  Thrombocytopenia -Platelets 160 on admission, now 148-last platelets and 2010 were 261  Polycythemia -Suspect this is secondary to chronic hypoxia-hemoglobin was 18.4 in 2009  Hypokalemia -Replaced  Elevated troponin -Most likely secondary to severe hypoxia-obtained EKG - shows NSR  with right axis deviation -no complaints of chest pain   Abdominal distention - ultrasound of abdomen on 6/25 was unrevealing- see below    09/27/2015 1st [facility] office visit/ Wert   Chief Complaint  Patient presents with  . Pulmonary Consult    Referred by Dr. Wynelle Cleveland. Pt c/o SOB for the past 6 wks. He gets winded walking to his mailbox and back.  He also c/o occ cough with clear  sputum.     Doe = MMRC3 = can't walk 100 yards even at a slow pace at a flat grade s stopping due to sob  Onset was x 6 months progressive decline best day walking due to sob esp worse x 6 weeks pta but much better since d/c on 02 when he remember to wear it.  Lots of cough/ upper airway features with hoarseness, non productive day > noct and no am excess or purulent mucus >>d/c ACE , change to ARB   10/07/2015 Follow up : Emphysema /PF /Chronic Resp on O2 /cor pulmonale  Patient returns for a two-week follow-up. Patient was seen last visit for a post hospital follow-up after recent hospitalization for hypercarbic and hypoxemic respiratory failure, cor pulmonale.  He was changed off ACE to ARB due to cough .  He had some trouble with insurance coverage, just started Losartan 100mg  daily on 10/05/15 . B/p is slightly  up today  We discused low salt diet , check b/p daily and follow up with PCP for further rx.  Recent CT chest in June showed emphysema and Pulmonary fibrosis .  PFT showed restrictive lung dz. With FEV1 at 52%, FVC at 53%.  Asian has been a heavy smoker but quit in June. Patient denies any hemoptysis, orthopnea, PND, or increased leg swelling. He does feel that his shortness of breath is improved. He denies any chest pain.  Current Medications, Allergies, Complete Past Medical History, Past Surgical History, Family History, and Social History were reviewed in [employer] record.           Review of Systems  Constitutional: Negative for activity change, appetite change, chills, fever and unexpected weight change.  HENT: Negative for congestion, dental problem, postnasal drip, rhinorrhea, sneezing, sore throat, trouble swallowing and voice change.   Eyes: Negative for visual disturbance.  Respiratory: . Negative for choking.   Cardiovascular: Negative for chest pain and leg swelling.  Gastrointestinal: Negative for abdominal pain, nausea and vomiting.    Genitourinary: Negative for difficulty urinating.  Musculoskeletal: Negative for arthralgias.  Skin: Negative for rash.  Psychiatric/Behavioral: Negative for behavioral problems and confusion.       Objective:   Physical Exam   Disheveled elderly wm Vital signs reviewed  Vitals:   10/07/15 1401 10/07/15 1416  BP:  (!) 152/78  Pulse: 87   SpO2: 94%   Weight: 245 lb (111.1 kg)   Height: 5' 10.5" (1.791 m)      HEENT: nl  turbinates, and oropharynx. Nl external ear canals without cough reflex - edentulous   NECK :  without JVD/Nodes/TM/ nl carotid upstrokes bilaterally   LUNGS: no acc muscle use,  Nl contour chest with a few insp crackles in bases  Bilaterally without exp wheeze and  without cough on insp or exp maneuvers   CV:  RRR  no s3 or murmur or increase in P2 tr edema   ABD:  soft and nontender with nl inspiratory excursion in the supine position. No bruits or organomegaly, bowel sounds  nl  MS:  Nl gait/ ext warm without deformities, calf tenderness, cyanosis / severe bil clubbing No obvious joint restrictions   SKIN: warm and dry without lesions    NEURO:  alert, approp, nl sensorium with  no motor deficits        CT Chest   08/28/15 No CT evidence of pulmonary embolism. Emphysema with chronic interstitial scarring. Bilateral hilar adenopathy of indeterminate clinical significance. Clinical correlation is recommended new Partially visualized ascites.    Robert Aversa NP-C  [facility]  10/07/2015

## 2015-10-10 ENCOUNTER — Telehealth: Payer: Self-pay | Admitting: Internal Medicine

## 2015-10-10 NOTE — Telephone Encounter (Signed)
Patient has Medicaid insurance. PA initiated through [employer]. Form completed, signed and faxed to [place].  Awaiting response.

## 2015-10-11 NOTE — Telephone Encounter (Signed)
Called [employer] at [phone] to check status of PA Spiriva Respimat has been approved from 10/10/15 - 10/04/16  [facility] [place].  Patient aware. Nothing further needed.

## 2015-11-10 ENCOUNTER — Institutional Professional Consult (permissible substitution): Payer: Self-pay | Admitting: Pulmonary Disease

## 2015-11-18 ENCOUNTER — Other Ambulatory Visit: Payer: Self-pay | Admitting: Internal Medicine

## 2015-11-18 DIAGNOSIS — J449 Chronic obstructive pulmonary disease, unspecified: Secondary | ICD-10-CM

## 2015-11-21 ENCOUNTER — Ambulatory Visit: Payer: Self-pay | Admitting: Internal Medicine

## 2015-11-21 ENCOUNTER — Ambulatory Visit (INDEPENDENT_AMBULATORY_CARE_PROVIDER_SITE_OTHER): Payer: Medicaid Other | Admitting: Internal Medicine

## 2015-11-21 DIAGNOSIS — J449 Chronic obstructive pulmonary disease, unspecified: Secondary | ICD-10-CM

## 2015-11-21 LAB — PULMONARY FUNCTION TEST
DL/VA % pred: 40 %
DL/VA: 1.84 ml/min/mmHg/L
DLCO COR % PRED: 29 %
DLCO cor: 9.29 ml/min/mmHg
DLCO unc % pred: 32 %
DLCO unc: 10.12 ml/min/mmHg
FEF 25-75 POST: 2.18 L/s
FEF 25-75 Pre: 2.16 L/sec
FEF2575-%Change-Post: 0 %
FEF2575-%Pred-Post: 74 %
FEF2575-%Pred-Pre: 74 %
FEV1-%CHANGE-POST: 0 %
FEV1-%Pred-Post: 77 %
FEV1-%Pred-Pre: 77 %
FEV1-POST: 2.73 L
FEV1-Pre: 2.71 L
FEV1FVC-%Change-Post: 0 %
FEV1FVC-%PRED-PRE: 99 %
FEV6-%Change-Post: 0 %
FEV6-%PRED-PRE: 80 %
FEV6-%Pred-Post: 81 %
FEV6-POST: 3.61 L
FEV6-Pre: 3.57 L
FEV6FVC-%Change-Post: 0 %
FEV6FVC-%PRED-POST: 104 %
FEV6FVC-%Pred-Pre: 105 %
FVC-%CHANGE-POST: 1 %
FVC-%PRED-POST: 78 %
FVC-%PRED-PRE: 77 %
FVC-POST: 3.62 L
FVC-PRE: 3.58 L
POST FEV6/FVC RATIO: 100 %
PRE FEV6/FVC RATIO: 100 %
Post FEV1/FVC ratio: 75 %
Pre FEV1/FVC ratio: 76 %
RV % pred: 88 %
RV: 1.91 L
TLC % PRED: 80 %
TLC: 5.47 L

## 2015-11-28 NOTE — Progress Notes (Signed)
Spoke with pt and notified of results per Dr. Wert. Pt verbalized understanding and denied any questions. 

## 2016-01-04 DEATH — deceased

## 2016-01-09 ENCOUNTER — Ambulatory Visit: Payer: Self-pay | Admitting: Internal Medicine

## 2017-12-21 IMAGING — CR DG CHEST 2V
2 series · 2 of 2 positions shown · non-contrast
Comparison: Chest radiograph from 05/30/2015

CLINICAL DATA: Acute onset of hypoxia and cough. Initial encounter.

EXAM:
CHEST  2 VIEW

[w chest lat]
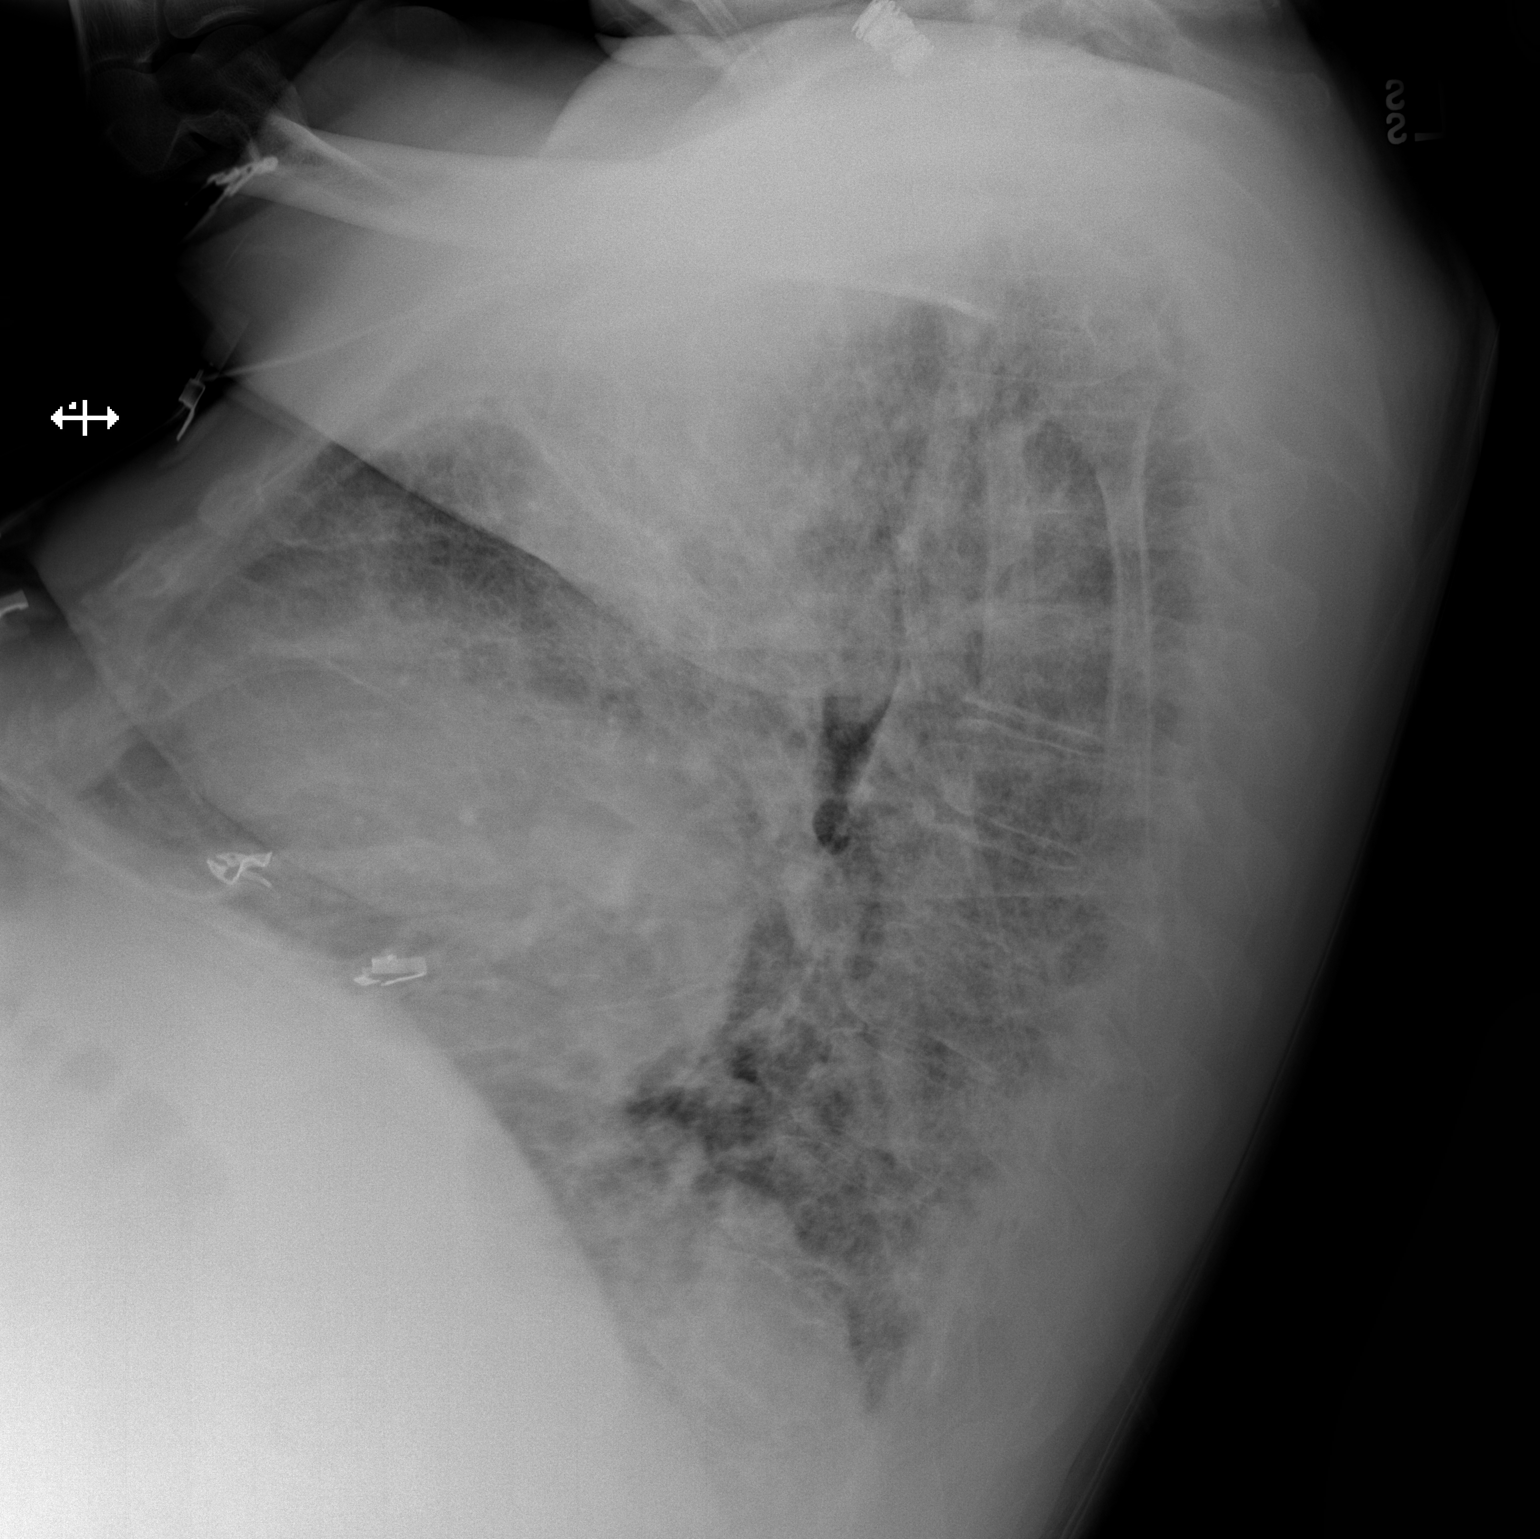

[x chest ap]
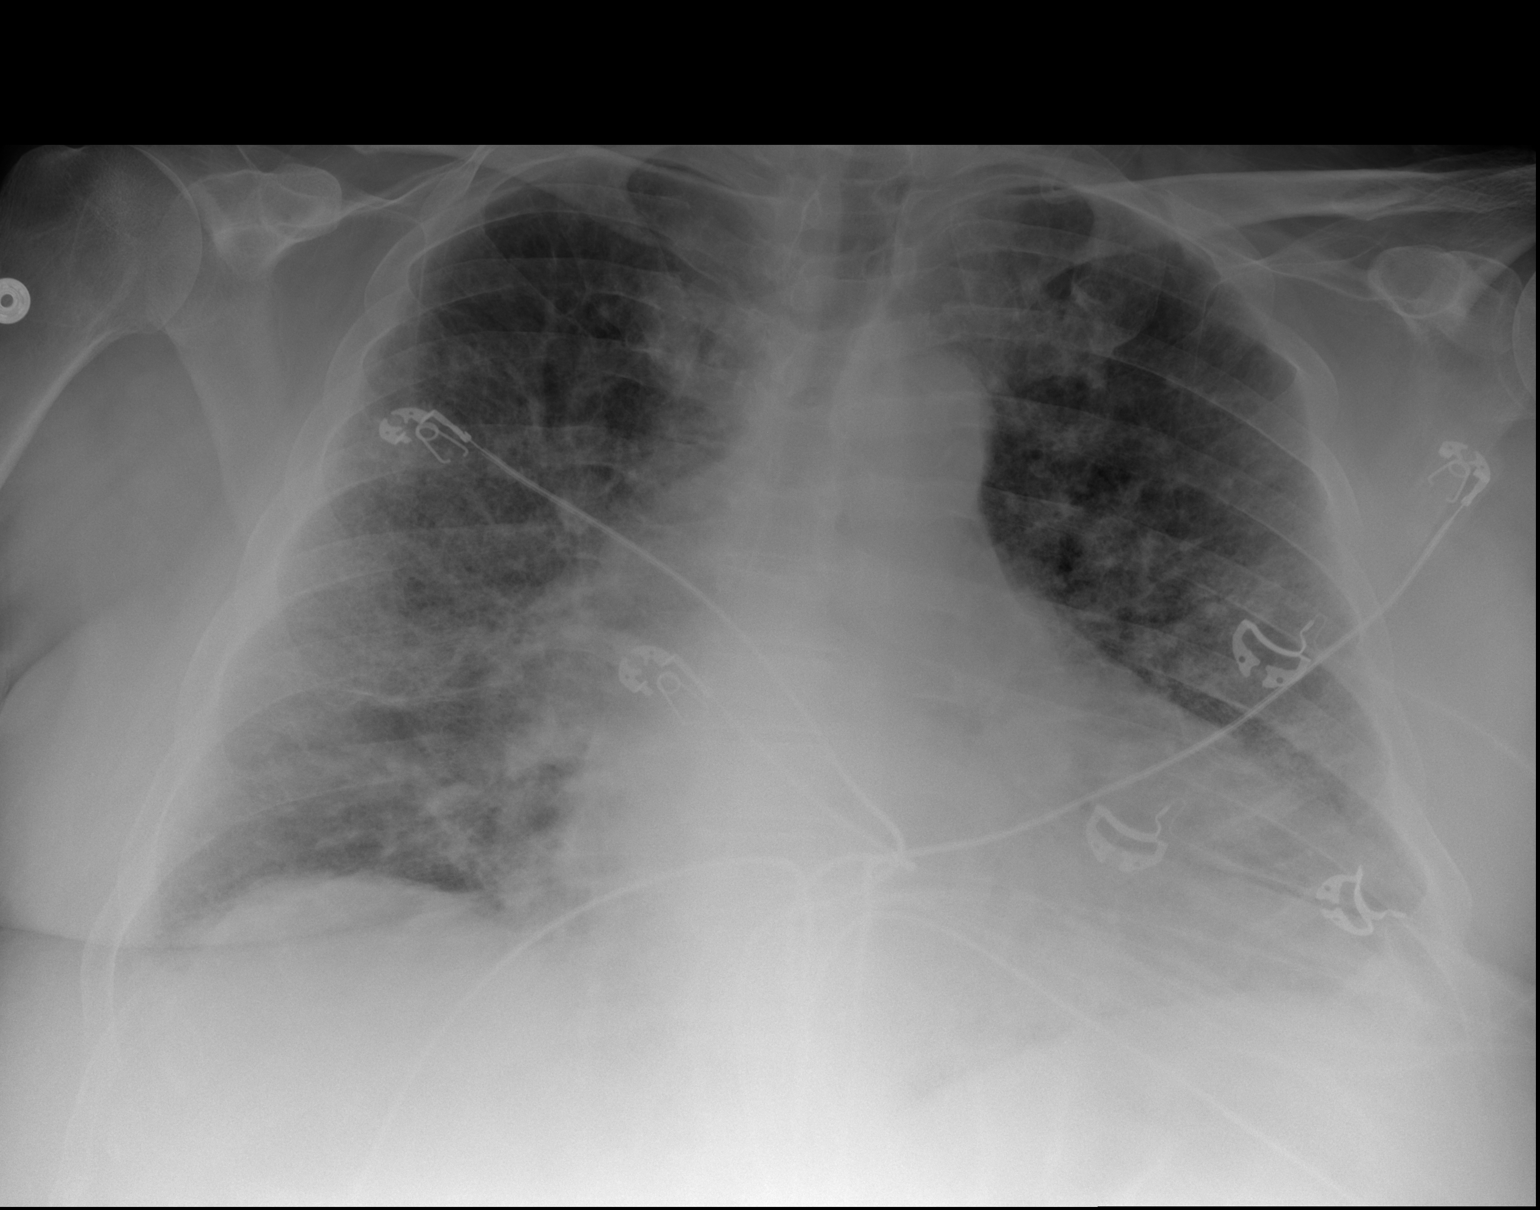

[2 of 2 positions shown; findings below may reference images not displayed]

FINDINGS: The lungs are well-aerated. Vascular congestion is noted. Increased
interstitial markings raise concern for mild pulmonary edema. Small
bilateral pleural effusions are seen. No pneumothorax is seen.

The cardiomediastinal silhouette is mildly enlarged. No acute
osseous abnormalities are identified.
IMPRESSION: Vascular congestion and mild cardiomegaly. Increased interstitial
markings raise concern for mild pulmonary edema. Small bilateral
pleural effusions seen.

## 2017-12-22 IMAGING — CT CT ANGIO CHEST
2 of 6 series · 17 of 46 positions shown · IV contrast (ISOVUE 370)
Comparison: Chest radiograph dated 08/27/2015

CLINICAL DATA: 51-year-old male with hypoxia and shortness of
breath

EXAM:
CT ANGIOGRAPHY CHEST WITH CONTRAST
TECHNIQUE: Multidetector CT imaging of the chest was performed using the
standard protocol during bolus administration of intravenous
contrast. Multiplanar CT image reconstructions and MIPs were
obtained to evaluate the vascular anatomy.
CONTRAST:  100 cc Isovue 370

[Series 7: coronal mpr · coronal · 0.53mm/px · 3 of 144 slices shown]
[im 36/144  soft-tissue]
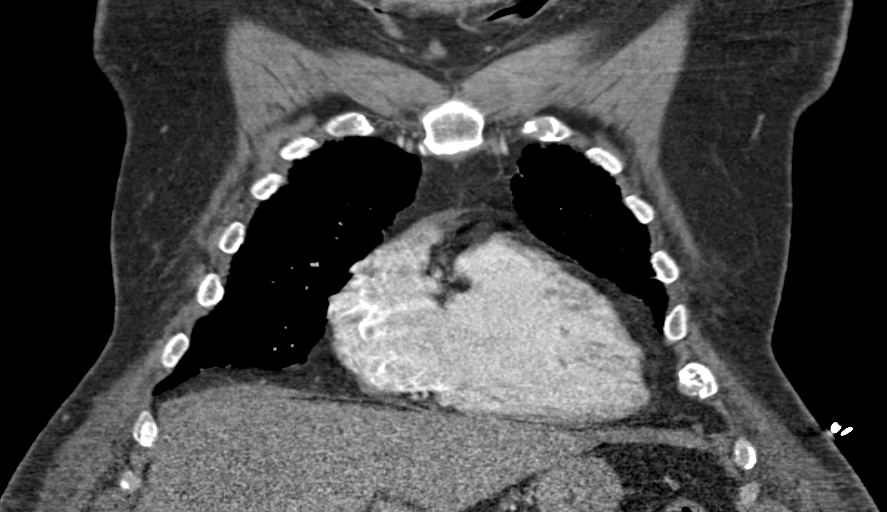
[im 72/144  soft-tissue]
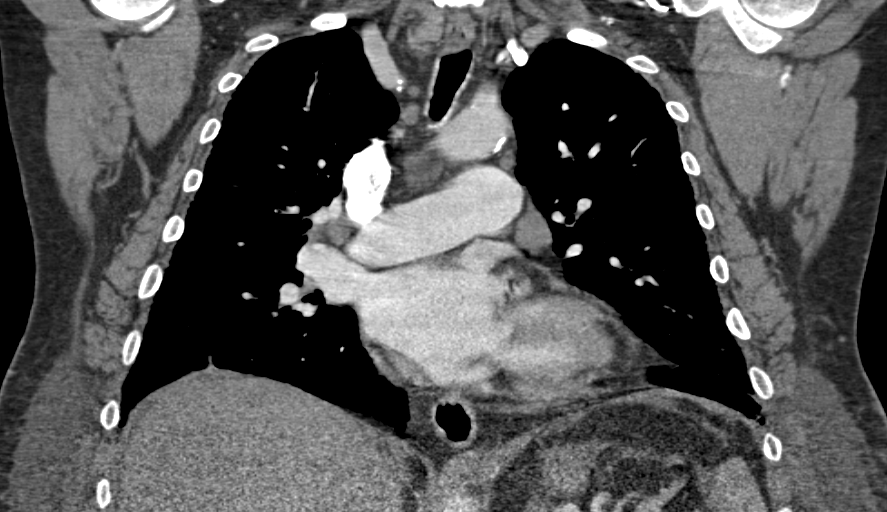
[im 108/144  soft-tissue]
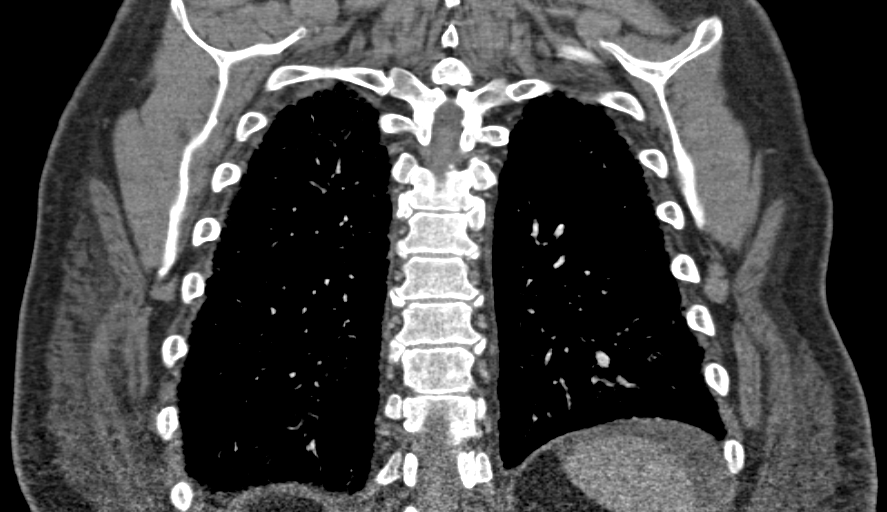

[Series 12: thins for pacs · axial · 0.87mm/px · z∈[-337,-90]mm · 14 of 271 slices shown]
[im 12/271  lung]
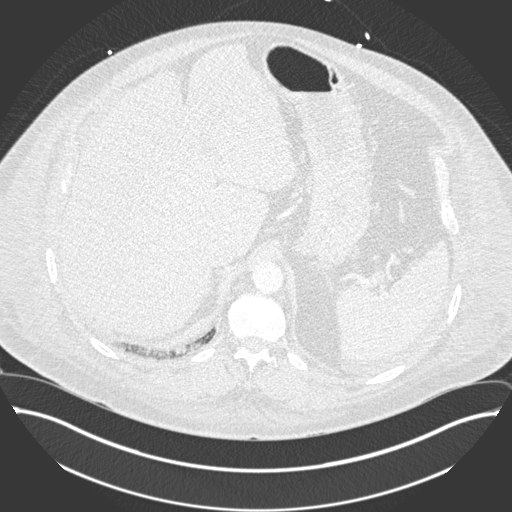
[im 36/271  soft-tissue]
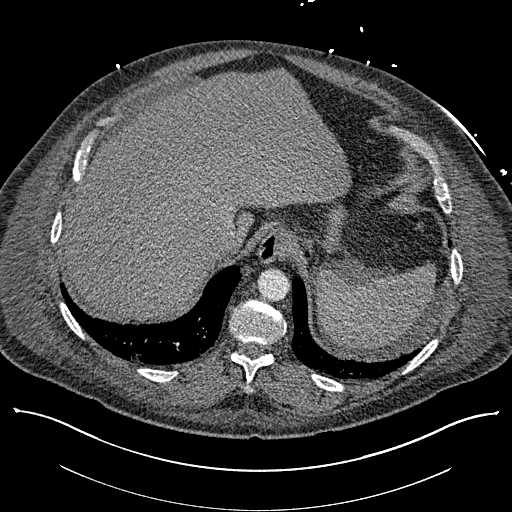
[im 47/271  lung]
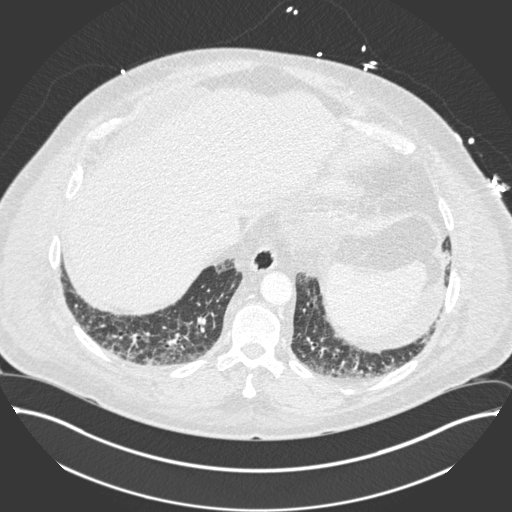
[im 71/271  soft-tissue]
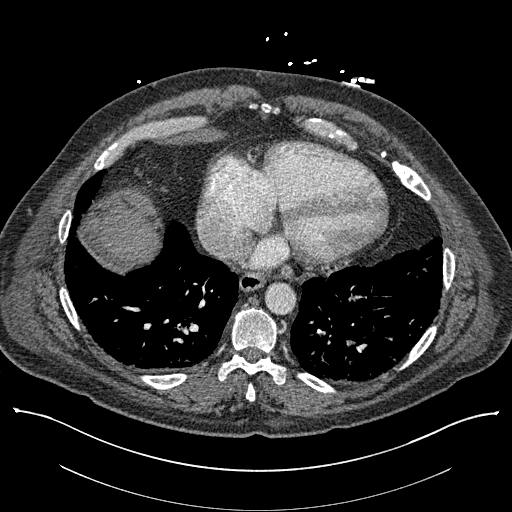
[im 94/271  lung]
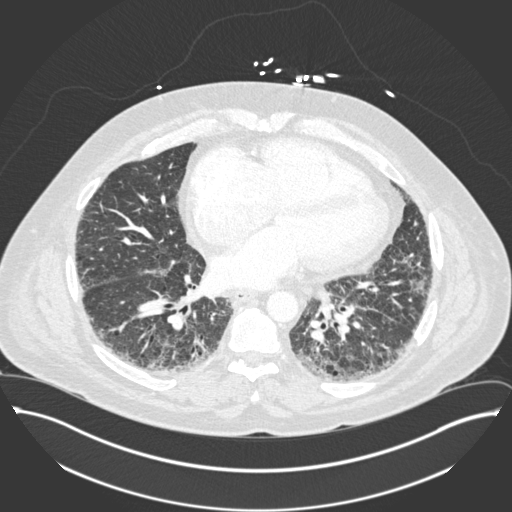
[im 106/271  soft-tissue]
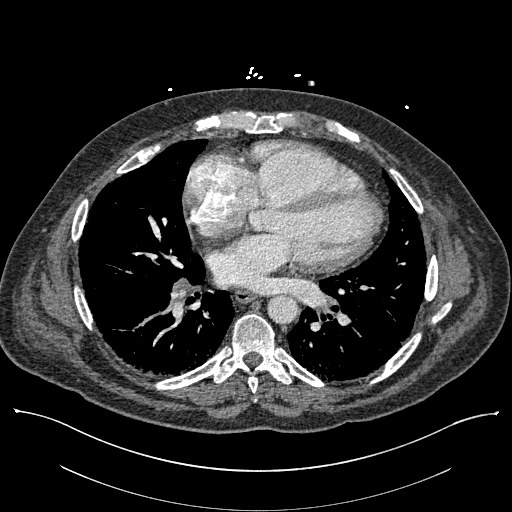
[im 130/271  lung]
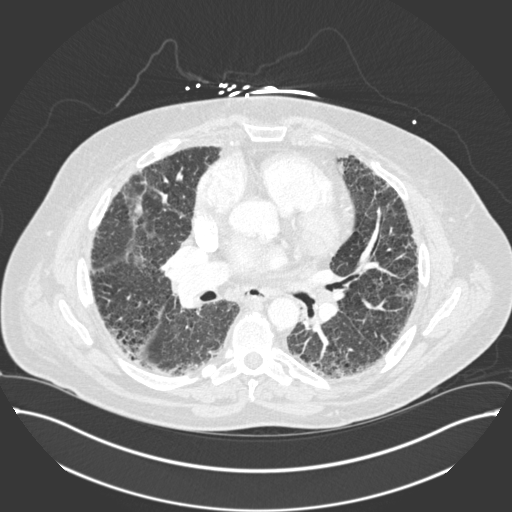
[im 141/271  soft-tissue]
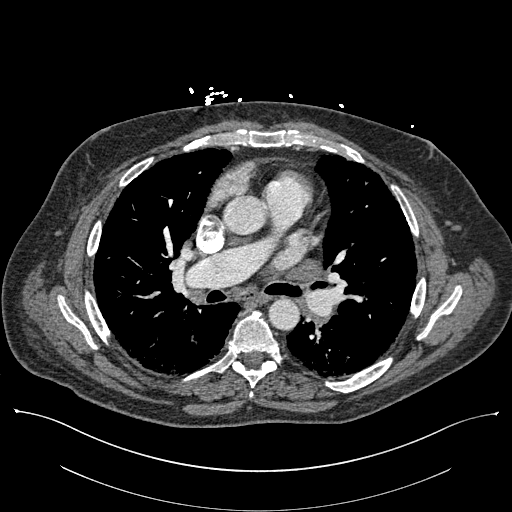
[im 165/271  lung]
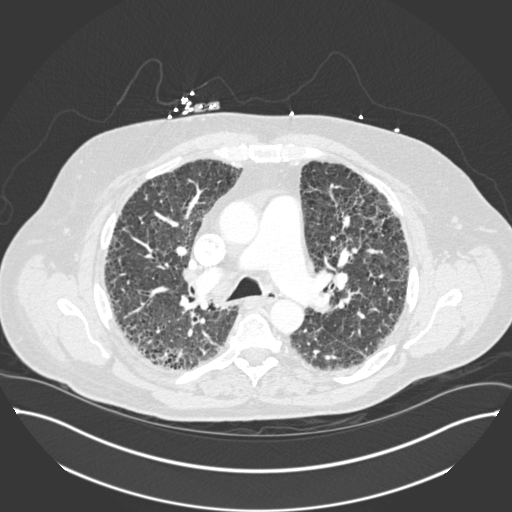
[im 177/271  soft-tissue]
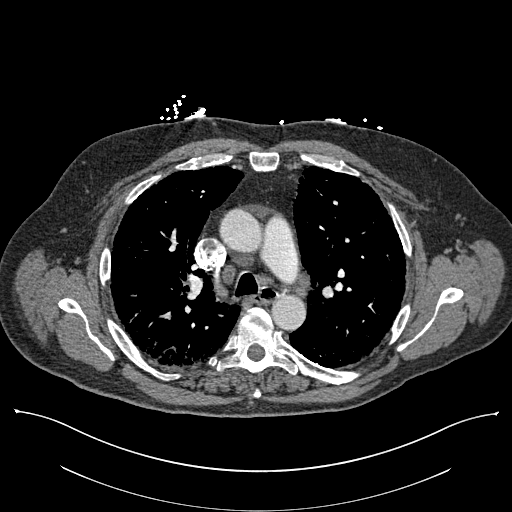
[im 200/271  lung]
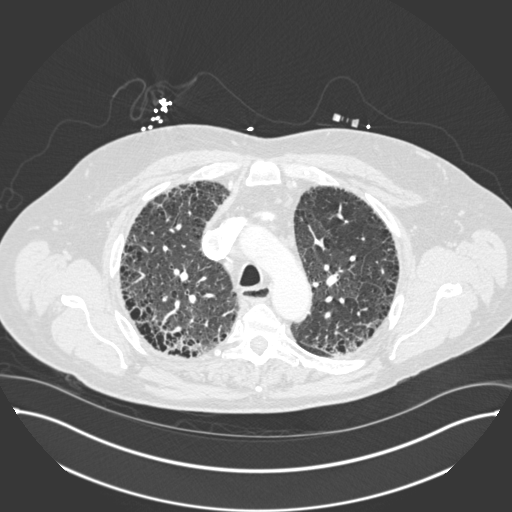
[im 224/271  soft-tissue]
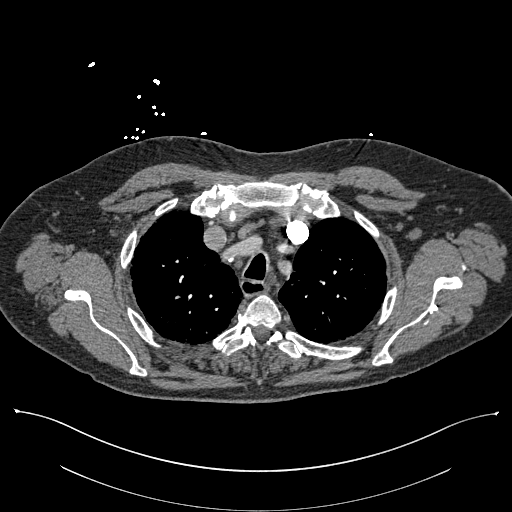
[im 235/271  lung]
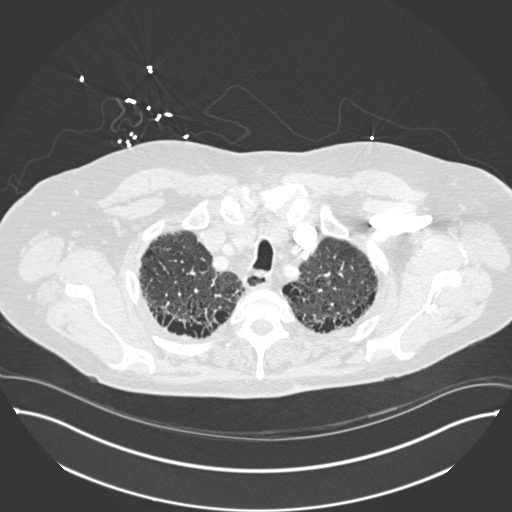
[im 259/271  soft-tissue]
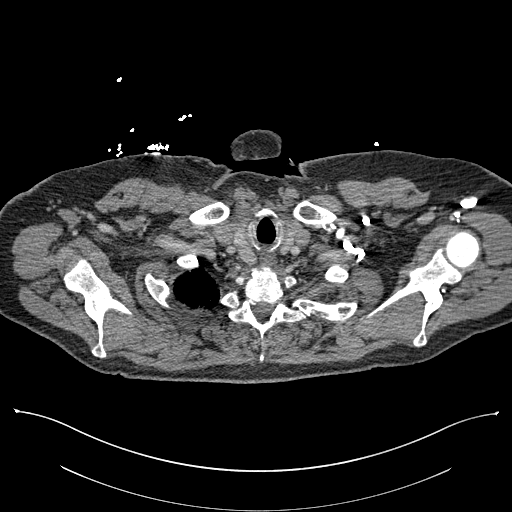

[17 of 46 positions shown; findings below may reference images not displayed]

FINDINGS: Evaluation of this exam is limited due to respiratory motion
artifact.

There is emphysematous changes of the lungs with chronic
interstitial coarsening. There is no focal consolidation, pleural
effusion, or pneumothorax. The central airways are patent.

The thoracic aorta appears unremarkable. The origins of the great
vessels of the aortic arch appear patent. No CT evidence of
pulmonary embolism. Top-normal cardiac size. No pericardial
effusion. There is coronary vascular calcification. Bilateral hilar
adenopathy of indeterminate clinical significance. Although these
may be reactive or related to underlying granulomatous disease, the
possibility of metastatic disease is not excluded. Clinical
correlation is recommended. The esophagus is grossly unremarkable.
No thyroid nodules identified.

There is no axillary adenopathy. There is diffuse subcutaneous
stranding and edema of the lower chest wall. There is mild
degenerative changes of the spine. No acute fracture.

Partially visualized small amount of ascites in the upper abdomen.
An ill-defined linear 5.3 cm hypodensity in the right lobe of the
liver is not well characterized but may represent vascular
structure.

Review of the MIP images confirms the above findings.
IMPRESSION: No CT evidence of pulmonary embolism.

Emphysema with chronic interstitial scarring.

Bilateral hilar adenopathy of indeterminate clinical significance.
Clinical correlation is recommended new

Partially visualized ascites.
